# Patient Record
Sex: Female | Born: 1965 | Race: White | Hispanic: No | Marital: Married | Smoking: Former smoker
Health system: Southern US, Community
[De-identification: ages and names within clinical notes are randomized; demographics above are authoritative.]

## PROBLEM LIST (undated history)

## (undated) DIAGNOSIS — T7840XA Allergy, unspecified, initial encounter: Secondary | ICD-10-CM

## (undated) DIAGNOSIS — I341 Nonrheumatic mitral (valve) prolapse: Secondary | ICD-10-CM

## (undated) DIAGNOSIS — F419 Anxiety disorder, unspecified: Secondary | ICD-10-CM

## (undated) DIAGNOSIS — E785 Hyperlipidemia, unspecified: Secondary | ICD-10-CM

## (undated) DIAGNOSIS — F418 Other specified anxiety disorders: Secondary | ICD-10-CM

## (undated) DIAGNOSIS — E559 Vitamin D deficiency, unspecified: Secondary | ICD-10-CM

## (undated) DIAGNOSIS — E349 Endocrine disorder, unspecified: Secondary | ICD-10-CM

## (undated) DIAGNOSIS — N8501 Benign endometrial hyperplasia: Secondary | ICD-10-CM

## (undated) HISTORY — DX: Vitamin D deficiency, unspecified: E55.9

## (undated) HISTORY — DX: Hyperlipidemia, unspecified: E78.5

## (undated) HISTORY — DX: Endocrine disorder, unspecified: E34.9

## (undated) HISTORY — PX: DILATION AND CURETTAGE OF UTERUS: SHX78

## (undated) HISTORY — DX: Nonrheumatic mitral (valve) prolapse: I34.1

## (undated) HISTORY — DX: Anxiety disorder, unspecified: F41.9

## (undated) HISTORY — DX: Other specified anxiety disorders: F41.8

## (undated) HISTORY — DX: Benign endometrial hyperplasia: N85.01

## (undated) HISTORY — DX: Allergy, unspecified, initial encounter: T78.40XA

---

## 1987-03-21 HISTORY — PX: AUGMENTATION MAMMAPLASTY: SUR837

## 2000-01-06 ENCOUNTER — Other Ambulatory Visit: Admission: RE | Admit: 2000-01-06 | Discharge: 2000-01-06 | Payer: Self-pay | Admitting: Gynecology

## 2000-02-24 ENCOUNTER — Encounter: Payer: Self-pay | Admitting: Gynecology

## 2000-02-24 ENCOUNTER — Ambulatory Visit (HOSPITAL_COMMUNITY): Admission: RE | Admit: 2000-02-24 | Discharge: 2000-02-24 | Payer: Self-pay | Admitting: Gynecology

## 2000-05-18 ENCOUNTER — Other Ambulatory Visit: Admission: RE | Admit: 2000-05-18 | Discharge: 2000-05-18 | Payer: Self-pay | Admitting: Gynecology

## 2000-05-18 ENCOUNTER — Encounter (INDEPENDENT_AMBULATORY_CARE_PROVIDER_SITE_OTHER): Payer: Self-pay

## 2000-10-13 ENCOUNTER — Encounter: Payer: Self-pay | Admitting: Gynecology

## 2000-10-13 ENCOUNTER — Ambulatory Visit (HOSPITAL_COMMUNITY): Admission: RE | Admit: 2000-10-13 | Discharge: 2000-10-13 | Payer: Self-pay | Admitting: Gynecology

## 2001-11-05 ENCOUNTER — Other Ambulatory Visit: Admission: RE | Admit: 2001-11-05 | Discharge: 2001-11-05 | Payer: Self-pay | Admitting: Gynecology

## 2003-04-29 ENCOUNTER — Other Ambulatory Visit: Admission: RE | Admit: 2003-04-29 | Discharge: 2003-04-29 | Payer: Self-pay | Admitting: Gynecology

## 2004-07-27 ENCOUNTER — Other Ambulatory Visit: Admission: RE | Admit: 2004-07-27 | Discharge: 2004-07-27 | Payer: Self-pay | Admitting: Gynecology

## 2005-01-13 ENCOUNTER — Ambulatory Visit (HOSPITAL_COMMUNITY): Admission: RE | Admit: 2005-01-13 | Discharge: 2005-01-13 | Payer: Self-pay | Admitting: Internal Medicine

## 2005-02-14 ENCOUNTER — Encounter: Admission: RE | Admit: 2005-02-14 | Discharge: 2005-02-14 | Payer: Self-pay | Admitting: Gynecology

## 2005-10-16 ENCOUNTER — Other Ambulatory Visit: Admission: RE | Admit: 2005-10-16 | Discharge: 2005-10-16 | Payer: Self-pay | Admitting: Gynecology

## 2006-04-03 ENCOUNTER — Encounter: Admission: RE | Admit: 2006-04-03 | Discharge: 2006-04-03 | Payer: Self-pay | Admitting: Gynecology

## 2006-10-18 ENCOUNTER — Other Ambulatory Visit: Admission: RE | Admit: 2006-10-18 | Discharge: 2006-10-18 | Payer: Self-pay | Admitting: Gynecology

## 2006-10-19 DIAGNOSIS — N8501 Benign endometrial hyperplasia: Secondary | ICD-10-CM | POA: Insufficient documentation

## 2006-10-19 HISTORY — DX: Benign endometrial hyperplasia: N85.01

## 2006-10-19 HISTORY — PX: ENDOMETRIAL ABLATION: SHX621

## 2006-11-15 ENCOUNTER — Encounter: Payer: Self-pay | Admitting: Gynecology

## 2006-11-15 ENCOUNTER — Ambulatory Visit (HOSPITAL_BASED_OUTPATIENT_CLINIC_OR_DEPARTMENT_OTHER): Admission: RE | Admit: 2006-11-15 | Discharge: 2006-11-15 | Payer: Self-pay | Admitting: Gynecology

## 2007-04-11 ENCOUNTER — Encounter: Admission: RE | Admit: 2007-04-11 | Discharge: 2007-04-11 | Payer: Self-pay | Admitting: Gynecology

## 2007-10-21 ENCOUNTER — Other Ambulatory Visit: Admission: RE | Admit: 2007-10-21 | Discharge: 2007-10-21 | Payer: Self-pay | Admitting: Gynecology

## 2007-11-06 ENCOUNTER — Other Ambulatory Visit: Admission: RE | Admit: 2007-11-06 | Discharge: 2007-11-06 | Payer: Self-pay | Admitting: Gynecology

## 2008-04-13 ENCOUNTER — Encounter: Admission: RE | Admit: 2008-04-13 | Discharge: 2008-04-13 | Payer: Self-pay | Admitting: Gynecology

## 2008-11-12 ENCOUNTER — Ambulatory Visit: Payer: Self-pay | Admitting: Gynecology

## 2008-11-12 ENCOUNTER — Encounter: Payer: Self-pay | Admitting: Gynecology

## 2008-11-12 ENCOUNTER — Other Ambulatory Visit: Admission: RE | Admit: 2008-11-12 | Discharge: 2008-11-12 | Payer: Self-pay | Admitting: Gynecology

## 2008-11-17 ENCOUNTER — Ambulatory Visit: Payer: Self-pay | Admitting: Gynecology

## 2009-04-15 ENCOUNTER — Encounter: Admission: RE | Admit: 2009-04-15 | Discharge: 2009-04-15 | Payer: Self-pay | Admitting: Gynecology

## 2009-05-13 ENCOUNTER — Ambulatory Visit (HOSPITAL_COMMUNITY): Admission: RE | Admit: 2009-05-13 | Discharge: 2009-05-13 | Payer: Self-pay | Admitting: Internal Medicine

## 2009-11-17 ENCOUNTER — Other Ambulatory Visit: Admission: RE | Admit: 2009-11-17 | Discharge: 2009-11-17 | Payer: Self-pay | Admitting: Gynecology

## 2009-11-17 ENCOUNTER — Ambulatory Visit: Payer: Self-pay | Admitting: Gynecology

## 2010-01-24 ENCOUNTER — Ambulatory Visit: Payer: Self-pay | Admitting: Gynecology

## 2010-01-26 ENCOUNTER — Encounter: Admission: RE | Admit: 2010-01-26 | Discharge: 2010-01-26 | Payer: Self-pay | Admitting: Gynecology

## 2010-04-18 ENCOUNTER — Encounter
Admission: RE | Admit: 2010-04-18 | Discharge: 2010-04-18 | Payer: Self-pay | Source: Home / Self Care | Attending: Gynecology | Admitting: Gynecology

## 2010-08-02 NOTE — Op Note (Signed)
[name], [name]                ACCOUNT NO.:  [id number]   MEDICAL RECORD NO.:  [id number]          PATIENT TYPE:  AMB   LOCATION:  [facility]                         FACILITY:  [facility]   PHYSICIAN:  Timothy P. Fontaine, M.D.DATE OF BIRTH:  June 15, 1965   DATE OF PROCEDURE:  11/15/2006  DATE OF DISCHARGE:                               OPERATIVE REPORT   PREOPERATIVE DIAGNOSES:  Menorrhagia.   POSTOPERATIVE DIAGNOSES:  Menorrhagia.   PROCEDURE:  Novasure endometrial ablation, hysteroscopy dilation and  curettage.   SURGEON:  Timothy P. Fontaine, M.D.   ANESTHETIC:  MAC with 1% lidocaine paracervical block.   ESTIMATED BLOOD LOSS:  Minimal.   DISTENDING MEDIA DISCREPANCY:  Minimal   COMPLICATIONS:  None.   SPECIMEN:  Endometrial curetting.   FINDINGS:  EUA external B U S vagina normal.  Cervix normal, uterus  normal size, shape and contour, anteverted.  Adnexa without masses.  Hysteroscopic good distention, normal appearing cavity, good even  treatment.   PROCEDURE:  The patient was taken to the operating room, underwent IV  sedation, placed in the low dorsal lithotomy position, received a  perineal vaginal preparation with Betadine solution.  Bladder emptied  with in-and-out Foley catheterization in sterile technique.  EUA was  performed.  The patient was draped in usual fashion.  The cervix  visualized with a speculum, anterior lip grasped with single-tooth  tenaculum and a paracervical block using 1% lidocaine was placed, a  total of 10 mL.  Cervix was then gently gradually dilated, the cervical  length and endometrial lengths, cavity lengths were determined.  A sharp  curettage was then performed and the specimen was sent to pathology.  The Novasure device was then placed within the cavity.  The wand  engaged, and the cavity width was then determined.  The carbon dioxide  test was then performed and passed and subsequently the endometrial  ablation was performed.  The  settings are recorded at the end of this  dictation.  The wand was removed.  Hysteroscopy performed, good  distention, good even treatment, no evidence of perforation.  Instruments were all removed, the tenaculum removed.  Slight oozing at  the tenaculum site was addressed with silver nitrate.  The  speculum was then removed.  The patient placed in supine position, taken  to recovery room in good condition having tolerated the procedure well.  Novasure settings, uterine length 8 cm, cervical length 3 cm, cavity  length 5 cm, cavity width of 3.4 cm, power setting 94, treatment cycle 1  minute 15 seconds.      Timothy P. Fontaine, M.D.  Electronically Signed     TPF/MEDQ  D:  11/15/2006  T:  11/16/2006  Job:  [id number]

## 2010-08-02 NOTE — H&P (Signed)
[name], Mary [name]                ACCOUNT NO.:  [id number]   MEDICAL RECORD NO.:  [id number]          PATIENT TYPE:  AMB   LOCATION:  [facility]                         FACILITY:  [facility]   PHYSICIAN:  Timothy P. Fontaine, M.D.DATE OF BIRTH:  1966-03-19   DATE OF ADMISSION:  11/15/2006  DATE OF DISCHARGE:                              HISTORY & PHYSICAL   The patient is being admitted to [facility] November 15, 2006, at 9:15 for surgery.   CHIEF COMPLAINT:  Menorrhagia.   HISTORY OF PRESENT ILLNESS:  A 46 year old G1, P63 female, vasectomy  birth control, with worsening menorrhagia. Periods lasting 8-10 days,  life altering with normal sonohistogram, normal thyroid prolactin.  The  risks, benefits, indications, alternatives were reviewed with her, and  she wants to proceed with endometrial ablation, hysteroscopy D&C.   PAST MEDICAL HISTORY:  Uncomplicated other than anxiety, depression.   CURRENT MEDICATIONS:  Wellbutrin XL 300 mg daily.   ALLERGIES:  No allergies.   PAST SURGICAL HISTORY:  Breast augmentation and cesarean section.   REVIEW OF SYSTEMS:  Noncontributory.   FAMILY HISTORY:  Noncontributory.   SOCIAL HISTORY:  Noncontributory.   PHYSICAL EXAMINATION:  VITAL SIGNS:  Afebrile, vital signs stable.  HEENT:  Normal.  LUNGS:  Clear.  CARDIAC:  Regular rate.  No rubs, murmurs or gallops.  ABDOMINAL:  Exam benign.  PELVIC:  External BUS, vagina normal.  Cervix normal.  Uterus normal  size, midline mobile, nontender.  Adnexa without masses or tenderness.   ASSESSMENT:  A 45 year old G1, P74 female, whose husband plans vasectomy  in 1 month, with worsening periods for endometrial ablation,  hysteroscopy D&C.  I reviewed with the patient and her husband the  proposed surgery, expected intraoperative and postoperative courses,  short-term long-term issues associated with ablation.  She understands  that she should never achieve pregnancy following the procedure.  Her  husband is planning a vasectomy, but if another partner presents that  she should never get pregnant.  She understands this.  She also  understands that she will continue to need routine gynecologic care to  include Pap smears and routine surveillance such as endometrial  carcinoma surveillance in the future despite that possibly becoming  amenorrheic following the procedure.  The patient also understands there  are no guarantees as far as menorrhagia relief, that her bleeding may  continue, worsen or change following the procedure. and the issues  hematometra long term was also discussed.  The acute intraoperative  postoperative risks were reviewed. The risks of bleeding, transfusion,  infection internal organ damage including bowel, bladder, ureters,  vessels and nerves necessitating major exploratory reparative surgeries,  future reparative surgeries, bowel resection, ostomy formation either  immediately recognized or delay recognized, either through direct  perforation or transuterine thermal damage was all discussed, understood  and accepted.  The patient's questions were answered to her  satisfaction.  She is ready to proceed with surgery.      Timothy P. Fontaine, M.D.  Electronically Signed     TPF/MEDQ  D:  11/13/2006  T:  11/13/2006  Job:  [id number] 

## 2011-01-02 ENCOUNTER — Encounter: Payer: Self-pay | Admitting: Gynecology

## 2011-01-02 ENCOUNTER — Other Ambulatory Visit (HOSPITAL_COMMUNITY)
Admission: RE | Admit: 2011-01-02 | Discharge: 2011-01-02 | Disposition: A | Discharge status: Home / Self Care | Payer: PRIVATE HEALTH INSURANCE | Source: Ambulatory Visit | Attending: Gynecology | Admitting: Gynecology

## 2011-01-02 ENCOUNTER — Ambulatory Visit (INDEPENDENT_AMBULATORY_CARE_PROVIDER_SITE_OTHER): Payer: PRIVATE HEALTH INSURANCE | Admitting: Gynecology

## 2011-01-02 VITALS — BP 120/70 | Ht 66.5 in | Wt 138.0 lb

## 2011-01-02 DIAGNOSIS — R823 Hemoglobinuria: Secondary | ICD-10-CM

## 2011-01-02 DIAGNOSIS — N898 Other specified noninflammatory disorders of vagina: Secondary | ICD-10-CM

## 2011-01-02 DIAGNOSIS — Z01419 Encounter for gynecological examination (general) (routine) without abnormal findings: Secondary | ICD-10-CM | POA: Insufficient documentation

## 2011-01-02 DIAGNOSIS — B373 Candidiasis of vulva and vagina: Secondary | ICD-10-CM

## 2011-01-02 MED ORDER — FLUCONAZOLE 150 MG PO TABS: 150.0000 mg | ORAL_TABLET | Freq: Once | ORAL | Status: AC

## 2011-01-02 NOTE — Telephone Encounter (Signed)
Erroneous encounter

## 2011-01-02 NOTE — Progress Notes (Signed)
Addended by: Landis Martins R on: 01/02/2011 10:34 AM   Modules accepted: Orders

## 2011-01-02 NOTE — Progress Notes (Signed)
Mary [name] June 22, 1965 [id number]        45 y.o.  for annual exam.  Was seen a year ago for left breast lump she had mammogram ultrasound which showed a fat lobule. She says this area has remained unchanged her self-exam and she had her follow up mammogram January 2012 which was normal  Past medical history,surgical history, medications, allergies, family history and social history were all reviewed and documented in the EPIC chart. ROS:  Was performed and pertinent positives and negatives are included in the history.  Exam: chaperone present Filed Vitals:   01/02/11 0937  BP: 120/70   General appearance  Normal Skin grossly normal Head/Neck normal with no cervical or supraclavicular adenopathy thyroid normal Lungs  clear Cardiac RR, without RMG Abdominal  soft, nontender, without masses, organomegaly or hernia Breasts  examined lying and sitting without masses, retractions, discharge or axillary adenopathy.  Bilateral implants noted. The area on the left where she is pointing is at the edge of her pectoralis muscle ridge and are no abnormalities palpated. I think what she is feeling is the edges of her implant. Pelvic  Ext/BUS/vagina  normal with white discharge, KOH wet prep done  Cervix  normal  Pap done  Uterus  anteverted, normal size, shape and contour, midline and mobile nontender   Adnexa  Without masses or tenderness    Anus and perineum  normal   Rectovaginal  normal sphincter tone without palpated masses or tenderness.    Assessment/Plan:  45 y.o. female for annual exam.    1. Breast mass. The area on the left where the patient's pointing I think is the edge of her implant. There is no palpable mass or other abnormality. Given the normal ultrasound showing a fat lobule negative mammogram and this area remained unchanged to her self-exam the past year we'll continue with expectant management and she is comfortable with this. If there is any change on self breast exam she is  to follow up for evaluation. 2. White discharge. KOH wet prep is positive for yeast. She notes after intercourse the last time she'll bleeding I think it probably is due to a vaginitis. We'll treat with Diflucan 150x1 dose follow up if symptoms persist or recur 3. Health maintenance. Self breast exams on a monthly basis again reviewed. As long as her exam remains unchanged we'll follow. She's due for her mammogram in January she knows to follow up for this. No blood work was done today as it's all done through her primary's office. Assuming she continues well then she'll see [place] in a year sooner as needed.    Dara Lords MD, 10:04 AM 01/02/2011

## 2011-03-09 ENCOUNTER — Telehealth: Payer: Self-pay | Admitting: *Deleted

## 2011-03-09 MED ORDER — FLUCONAZOLE 150 MG PO TABS: 150.0000 mg | ORAL_TABLET | Freq: Once | ORAL | Status: AC

## 2011-03-09 NOTE — Telephone Encounter (Signed)
Pt calling c/o yeast infection itching, white discharge x 2 days now. Pt last office visit on 01/02/11. Pt would like diflucan sent to pharmacy. Please advise

## 2011-03-09 NOTE — Telephone Encounter (Signed)
Diflucan 150mg x 1

## 2011-03-09 NOTE — Telephone Encounter (Signed)
Pt informed with the below note. 

## 2011-04-19 ENCOUNTER — Other Ambulatory Visit: Payer: Self-pay | Admitting: Gynecology

## 2011-04-19 DIAGNOSIS — Z1231 Encounter for screening mammogram for malignant neoplasm of breast: Secondary | ICD-10-CM

## 2011-05-05 ENCOUNTER — Ambulatory Visit
Admission: RE | Admit: 2011-05-05 | Discharge: 2011-05-05 | Disposition: A | Discharge status: Home / Self Care | Payer: PRIVATE HEALTH INSURANCE | Source: Ambulatory Visit | Attending: Gynecology | Admitting: Gynecology

## 2011-05-05 DIAGNOSIS — Z1231 Encounter for screening mammogram for malignant neoplasm of breast: Secondary | ICD-10-CM

## 2012-03-19 ENCOUNTER — Encounter: Payer: PRIVATE HEALTH INSURANCE | Admitting: Gynecology

## 2012-03-20 DIAGNOSIS — R7989 Other specified abnormal findings of blood chemistry: Secondary | ICD-10-CM

## 2012-03-20 DIAGNOSIS — E349 Endocrine disorder, unspecified: Secondary | ICD-10-CM

## 2012-03-20 HISTORY — DX: Endocrine disorder, unspecified: E34.9

## 2012-03-20 HISTORY — DX: Other specified abnormal findings of blood chemistry: R79.89

## 2012-04-08 ENCOUNTER — Other Ambulatory Visit (HOSPITAL_COMMUNITY)
Admission: RE | Admit: 2012-04-08 | Discharge: 2012-04-08 | Disposition: A | Discharge status: Home / Self Care | Payer: BC Managed Care – PPO | Source: Ambulatory Visit | Attending: Gynecology | Admitting: Gynecology

## 2012-04-08 ENCOUNTER — Encounter: Payer: Self-pay | Admitting: Gynecology

## 2012-04-08 ENCOUNTER — Ambulatory Visit (INDEPENDENT_AMBULATORY_CARE_PROVIDER_SITE_OTHER): Payer: BC Managed Care – PPO | Admitting: Gynecology

## 2012-04-08 VITALS — BP 116/74 | Ht 66.0 in | Wt 145.0 lb

## 2012-04-08 DIAGNOSIS — Z1151 Encounter for screening for human papillomavirus (HPV): Secondary | ICD-10-CM | POA: Insufficient documentation

## 2012-04-08 DIAGNOSIS — N951 Menopausal and female climacteric states: Secondary | ICD-10-CM

## 2012-04-08 DIAGNOSIS — Z01419 Encounter for gynecological examination (general) (routine) without abnormal findings: Secondary | ICD-10-CM

## 2012-04-08 DIAGNOSIS — I341 Nonrheumatic mitral (valve) prolapse: Secondary | ICD-10-CM | POA: Insufficient documentation

## 2012-04-08 NOTE — Progress Notes (Signed)
LERLINE VALDIVIA 1965-11-23 [id number]        47 y.o.  G1P1001 for annual exam.  Several issues noted below.  Past medical history,surgical history, medications, allergies, family history and social history were all reviewed and documented in the EPIC chart. ROS:  Was performed and pertinent positives and negatives are included in the history.  Exam: [name] assistant Filed Vitals:   04/08/12 1355  BP: 116/74  Height: 5\' 6"  (1.676 m)  Weight: 145 lb (65.772 kg)   General appearance  Normal Skin grossly normal Head/Neck normal with no cervical or supraclavicular adenopathy thyroid normal Lungs  clear Cardiac RR, without RMG Abdominal  soft, nontender, without masses, organomegaly or hernia Breasts  examined lying and sitting without masses, retractions, discharge or axillary adenopathy.  Bilateral implants noted. Pelvic  Ext/BUS/vagina  normal   Cervix  With stenotic os. Pap/HPV done  Uterus  anteverted, normal size, shape and contour, midline and mobile nontender   Adnexa  Without masses or tenderness    Anus and perineum  normal   Rectovaginal  normal sphincter tone without palpated masses or tenderness.    Assessment/Plan:  47 y.o. G47P1001 female for annual exam.   1. History of NovaSure endometrial ablation.  Amenorrheic. She does note occasional hot flash type symptoms. We'll check baseline TSH FSH. 2. Pap smear 12/2010 without endocervical cells but otherwise normal.  Her cervix does appear stenotic and I did a Pap/HPV and we'll see what this returns in and triage based upon these results. 3. Mammography.  Due February 2014. Patient knows to schedule this.  She is an area at the edge of her implant in the left breast 12:00 position where she has felt some nodularity in the past. Practitioner exams have always been normal and she had an ultrasound which showed just normal fatty tissue in this area. This area has remained unchanged her exam and again today I feel no abnormalities. We'll  continue with observation. If she feels any change in his to represent for evaluation. 4. Health maintenance.  Dr. Oneta Rack does her routine blood work and she sees them on a regular basis.we'll follow for her hormone studies and Pap smear results otherwise in one year for her annual exam.    Dara Lords MD, 2:59 PM 04/08/2012

## 2012-04-08 NOTE — Patient Instructions (Signed)
Office will contact you with Pap smear and hormone levels. Otherwise follow up in one year for your annual exam.

## 2012-04-09 ENCOUNTER — Encounter: Payer: Self-pay | Admitting: Gynecology

## 2012-04-09 LAB — URINALYSIS W MICROSCOPIC + REFLEX CULTURE
Bilirubin Urine: NEGATIVE
Casts: NONE SEEN
Glucose, UA: NEGATIVE mg/dL
Protein, ur: NEGATIVE mg/dL
Specific Gravity, Urine: 1.015 (ref 1.005–1.030)
Squamous Epithelial / LPF: NONE SEEN

## 2012-05-23 ENCOUNTER — Other Ambulatory Visit: Payer: Self-pay

## 2012-05-23 DIAGNOSIS — Z1231 Encounter for screening mammogram for malignant neoplasm of breast: Secondary | ICD-10-CM

## 2012-06-27 ENCOUNTER — Ambulatory Visit
Admission: RE | Admit: 2012-06-27 | Discharge: 2012-06-27 | Disposition: A | Discharge status: Home / Self Care | Payer: BC Managed Care – PPO | Source: Ambulatory Visit

## 2012-06-27 DIAGNOSIS — Z1231 Encounter for screening mammogram for malignant neoplasm of breast: Secondary | ICD-10-CM

## 2012-06-28 ENCOUNTER — Other Ambulatory Visit: Payer: Self-pay | Admitting: *Deleted

## 2012-06-28 DIAGNOSIS — N6310 Unspecified lump in the right breast, unspecified quadrant: Secondary | ICD-10-CM

## 2012-07-11 ENCOUNTER — Ambulatory Visit
Admission: RE | Admit: 2012-07-11 | Discharge: 2012-07-11 | Disposition: A | Discharge status: Home / Self Care | Payer: BC Managed Care – PPO | Source: Ambulatory Visit | Attending: Gynecology | Admitting: Gynecology

## 2012-07-11 DIAGNOSIS — N6310 Unspecified lump in the right breast, unspecified quadrant: Secondary | ICD-10-CM

## 2013-01-23 ENCOUNTER — Telehealth: Payer: Self-pay | Admitting: *Deleted

## 2013-01-23 DIAGNOSIS — N390 Urinary tract infection, site not specified: Secondary | ICD-10-CM

## 2013-01-23 MED ORDER — SULFAMETHOXAZOLE-TMP DS 800-160 MG PO TABS: 1.0000 | ORAL_TABLET | Freq: Two times a day (BID) | ORAL | Status: DC

## 2013-01-23 NOTE — Telephone Encounter (Signed)
Advised patient Bactrim DS was sent escript to [facility]

## 2013-02-28 ENCOUNTER — Telehealth: Payer: Self-pay | Admitting: *Deleted

## 2013-02-28 MED ORDER — FLUCONAZOLE 150 MG PO TABS: 150.0000 mg | ORAL_TABLET | Freq: Once | ORAL | Status: DC

## 2013-02-28 NOTE — Telephone Encounter (Signed)
Pt informed, rx sent 

## 2013-02-28 NOTE — Telephone Encounter (Signed)
Diflucan 150mg x 1 dose

## 2013-02-28 NOTE — Telephone Encounter (Signed)
Pt calling c/o yeast infection itching white discharge, pt unable to come today her schedule. Pt is requesting Rx to help with infection, she is scheduled for annual on 04/10/13. Please advise

## 2013-04-10 ENCOUNTER — Encounter: Payer: Self-pay | Admitting: Gynecology

## 2013-04-10 ENCOUNTER — Ambulatory Visit (INDEPENDENT_AMBULATORY_CARE_PROVIDER_SITE_OTHER): Payer: BC Managed Care – PPO | Admitting: Gynecology

## 2013-04-10 VITALS — BP 112/66 | Ht 66.0 in | Wt 148.0 lb

## 2013-04-10 DIAGNOSIS — Z01419 Encounter for gynecological examination (general) (routine) without abnormal findings: Secondary | ICD-10-CM

## 2013-04-10 DIAGNOSIS — N951 Menopausal and female climacteric states: Secondary | ICD-10-CM

## 2013-04-10 NOTE — Progress Notes (Signed)
Mary [name] 1966/02/26 [id number]        48 y.o.  G1P1001 for annual exam.  Several issues noted below.  Past medical history,surgical history, problem list, medications, allergies, family history and social history were all reviewed and documented in the EPIC chart.  ROS:  Performed and pertinent positives and negatives are included in the history, assessment and plan .  Exam: [name] assistant Filed Vitals:   04/10/13 1049  BP: 112/66  Height: 5\' 6"  (1.676 m)  Weight: 148 lb (67.132 kg)   General appearance  Normal Skin grossly normal Head/Neck normal with no cervical or supraclavicular adenopathy thyroid normal Lungs  clear Cardiac RR, without RMG Abdominal  soft, nontender, without masses, organomegaly or hernia Breasts  examined lying and sitting without masses, retractions, discharge or axillary adenopathy. Bilateral implants noted. Pelvic  Ext/BUS/vagina  Normal  Cervix  Normal  Uterus  anteverted, normal size, shape and contour, midline and mobile nontender   Adnexa  Without masses or tenderness    Anus and perineum  Normal   Rectovaginal  Normal sphincter tone without palpated masses or tenderness.    Assessment/Plan:  48 y.o. G6P1001 female for annual exam, amenorrheic, vasectomy birth control.   1. Amenorrhea. Patient has been amenorrheic since her ablation. Is having some hot flashes and vaginal dryness. Ragan last year 29.9. No bleeding at all. Options for management to include vaginal lubricants/moisturizers, OTC soy based products, HRT reviewed. Risks/benefits discussed to include WHI study was recorded at DVT and breast cancer. ACOG and NAMS statements for lowest dose for shortest period of time discussed. Patient is not interested in HRT at this point. Will call if she wants to pursue this. Knows to report any bleeding. 2. Pap/HPV negative 03/2012. Pap smear done today. No history of abnormal Pap smears previously. Plan repeat Pap smear in 3-5 year  interval. 3. Mammography/2014. Has always had an area of nodularity age of her left breast implant 12:00 that she has felt but has never been appreciated by practitioner exams. Mammography sent always been normal. This area has remained unchanged for the patient. Will continue to monitor and report any changes to self-exam. Her exam today is totally normal with the implants. Continue with annual mammography. SBE reviewed. 4. Health maintenance.   routine blood work reported to her primary physician's office. No blood work done today. Followup in one year, sooner as needed.   Note: This document was prepared with digital dictation and possible smart phrase technology. Any transcriptional errors that result from this process are unintentional.   Anastasio Auerbach MD, 11:11 AM 04/10/2013

## 2013-04-10 NOTE — Patient Instructions (Signed)
Followup in one year for annual exam. Sooner if you want to rediscuss options for menopausal symptoms.

## 2013-04-11 LAB — URINALYSIS W MICROSCOPIC + REFLEX CULTURE
Casts: NONE SEEN
Glucose, UA: NEGATIVE mg/dL
Hgb urine dipstick: NEGATIVE
LEUKOCYTES UA: NEGATIVE
NITRITE: NEGATIVE
PROTEIN: NEGATIVE mg/dL
RBC / HPF: 2 RBC/hpf (ref <3)
SPECIFIC GRAVITY, URINE: 1.03 (ref 1.005–1.030)
UROBILINOGEN UA: 1 mg/dL (ref 0.0–1.0)
pH: 6 (ref 5.0–8.0)

## 2013-04-12 LAB — URINE CULTURE
Colony Count: NO GROWTH
ORGANISM ID, BACTERIA: NO GROWTH

## 2013-06-10 ENCOUNTER — Other Ambulatory Visit: Payer: Self-pay

## 2013-06-10 DIAGNOSIS — Z9882 Breast implant status: Secondary | ICD-10-CM

## 2013-06-10 DIAGNOSIS — Z1231 Encounter for screening mammogram for malignant neoplasm of breast: Secondary | ICD-10-CM

## 2013-07-08 ENCOUNTER — Encounter (INDEPENDENT_AMBULATORY_CARE_PROVIDER_SITE_OTHER): Payer: Self-pay

## 2013-07-08 ENCOUNTER — Ambulatory Visit
Admission: RE | Admit: 2013-07-08 | Discharge: 2013-07-08 | Disposition: A | Discharge status: Home / Self Care | Payer: BC Managed Care – PPO | Source: Ambulatory Visit

## 2013-07-08 DIAGNOSIS — Z1231 Encounter for screening mammogram for malignant neoplasm of breast: Secondary | ICD-10-CM

## 2013-07-08 DIAGNOSIS — Z9882 Breast implant status: Secondary | ICD-10-CM

## 2013-08-28 ENCOUNTER — Telehealth: Payer: Self-pay

## 2013-08-28 NOTE — Telephone Encounter (Signed)
Patient called stating she had Novasure endo ablation 7-8 years ago and last night after sex she had bleeding. Never has happened before. Has stopped now.  She wanted to know if all ok. I recommended office visit to assess and transferred her to appt desk.

## 2013-09-09 ENCOUNTER — Encounter: Payer: Self-pay | Admitting: Gynecology

## 2013-09-09 ENCOUNTER — Ambulatory Visit (INDEPENDENT_AMBULATORY_CARE_PROVIDER_SITE_OTHER): Payer: BC Managed Care – PPO | Admitting: Gynecology

## 2013-09-09 DIAGNOSIS — N930 Postcoital and contact bleeding: Secondary | ICD-10-CM

## 2013-09-09 DIAGNOSIS — N898 Other specified noninflammatory disorders of vagina: Secondary | ICD-10-CM

## 2013-09-09 LAB — WET PREP FOR TRICH, YEAST, CLUE
Clue Cells Wet Prep HPF POC: NONE SEEN
Trich, Wet Prep: NONE SEEN

## 2013-09-09 MED ORDER — FLUCONAZOLE 150 MG PO TABS: 150.0000 mg | ORAL_TABLET | Freq: Once | ORAL | Status: DC

## 2013-09-09 NOTE — Patient Instructions (Signed)
Take the Diflucan pills one daily for 2 days. Call if the bleeding continues.

## 2013-09-09 NOTE — Progress Notes (Signed)
Mary [name] 11/30/1965 [id number]        48 y.o.  G1P1001 presents with single episode of post coital spotting last week. History of endometrial ablation with no menses or bleeding at all. Had no discomfort with the episode of bleeding/intercourse. No irritation or vaginal discharge or odor. Has not had any episodes since.  Past medical history,surgical history, problem list, medications, allergies, family history and social history were all reviewed and documented in the EPIC chart.  Directed ROS with pertinent positives and negatives documented in the history of present illness/assessment and plan.  Exam: Mary [name] General appearance  Normal Abdomen soft without masses guarding rebound Pelvic external BUS vagina with slight white discharge. Cervix normal. Uterus normal size midline mobile nontender. Adnexa without masses or tenderness  Assessment/Plan:  48 y.o. G1P1001 with single episode of post coital bleeding. Wet prep is positive for yeast. Suspect some mild cervicitis as etiology. We'll treat with Diflucan 150 mg x2 days. Followup if recurs. If so then we'll start with sonohysterogram to rule out intracavitary abnormalities.   Note: This document was prepared with digital dictation and possible smart phrase technology. Any transcriptional errors that result from this process are unintentional.   Mary Auerbach MD, 9:55 AM 09/09/2013

## 2013-09-09 NOTE — Addendum Note (Signed)
Addended by: Nelva Nay on: 09/09/2013 11:52 AM   Modules accepted: Orders

## 2013-11-10 ENCOUNTER — Ambulatory Visit (INDEPENDENT_AMBULATORY_CARE_PROVIDER_SITE_OTHER): Payer: BC Managed Care – PPO | Admitting: Physician Assistant

## 2013-11-10 ENCOUNTER — Encounter: Payer: Self-pay | Admitting: Physician Assistant

## 2013-11-10 VITALS — BP 110/62 | HR 84 | Temp 97.9°F | Resp 16 | Ht 66.0 in | Wt 153.0 lb

## 2013-11-10 DIAGNOSIS — N390 Urinary tract infection, site not specified: Secondary | ICD-10-CM

## 2013-11-10 MED ORDER — FLUCONAZOLE 150 MG PO TABS: 150.0000 mg | ORAL_TABLET | Freq: Every day | ORAL | Status: DC

## 2013-11-10 MED ORDER — CIPROFLOXACIN HCL 500 MG PO TABS
500.0000 mg | ORAL_TABLET | Freq: Two times a day (BID) | ORAL | Status: DC
Start: 2013-11-10 — End: 2014-03-10

## 2013-11-10 NOTE — Patient Instructions (Signed)
VAGINAL DRYNESS OVERVIEW  Vaginal dryness, also known as atrophic vaginitis, is a common condition in postmenopausal women. This condition is also common in women who have had both ovaries removed at the time of hysterectomy.   Some women have uncomfortable symptoms of vaginal dryness, such as pain with sex, burning vaginal discomfort or itching, or abnormal vaginal discharge, while others have no symptoms at all.  VAGINAL DRYNESS CAUSES   Estrogen helps to keep the vagina moist and to maintain thickness of the vaginal lining. Vaginal dryness occurs when the ovaries produce a decreased amount of estrogen. This can occur at certain times in a woman's life, and may be permanent or temporary. Times when less estrogen is made include: ?At the time of menopause. ?After surgical removal of the ovaries, chemotherapy, or radiation therapy of the pelvis for cancer. ?After having a baby, particularly in women who breastfeed. ?While using certain medications, such as danazol, medroxyprogesterone (brand names: Provera or DepoProvera), leuprolide (brand name: Lupron), or nafarelin. When these medications are stopped, estrogen production resumes.  Women who smoke cigarettes have been shown to have an increased risk of an earlier menopause transition as compared to non-smokers. Therefore, atrophic vaginitis symptoms may appear at a younger age in this population.  VAGINAL DRYNESS TREATMENT   There are three treatment options for women with vaginal dryness:  Vaginal lubricants and moisturizers - Vaginal lubricants and moisturizers can be purchased without a prescription. These products do not contain any hormones and have virtually no side effects. - Albolene is found in the facial cleanser section at [facility], [facility], or [employer]. It is a large jar with a blue top. This is the best lubricant for women because it is hypoallergenic. -Natural lubricants, such as olive, avocado or peanut oil, are easily available  products that may be used as a lubricant with sex.  -Vaginal moisturizes (eg, Replens, Moist Again, Vagisil, K-Y Silk-E, and Feminease) are formulated to allow water to be retained in the vaginal tissues. Moisturizers are applied into the vagina three times weekly to allow a continued moisturizing effect. These should not be used just before having sex, as they can be irritating.  Vaginal estrogen - Vaginal estrogen is the most effective treatment option for women with vaginal dryness. Vaginal estrogen must be prescribed by a healthcare provider. Very low doses of vaginal estrogen can be used when it is put into the vagina to treat vaginal dryness. A small amount of estrogen is absorbed into the bloodstream, but only about 100 times less than when using estrogen pills or tablets. As a result, there is a much lower risk of side effects, such as blood clots, breast cancer, and heart attack, compared with other estrogen-containing products (birth control pills, menopausal hormone therapy).   Ospemifene - Ospemifene is a prescription medication that is similar to estrogen, but is not estrogen. In the vaginal tissue, it acts similarly to estrogen. In the breast tissue, it acts as an estrogen blocker. It comes in a pill, and is prescribed for women who want to use an estrogen-like medication for vaginal dryness or painful sex associated with vaginal dryness, but prefer not to use a vaginal medication. The medication may cause hot flashes as a side effect. This type of medication may increase the risk of blood clots or uterine cancer. Further study of ospemifene is needed to evaluate the risk of these complications. This medication has not been tested in women who have had breast cancer or are at a high risk of developing   breast cancer.    Sexual activity - Vaginal estrogen improves vaginal dryness quickly, usually within a few weeks. You may continue to have sex as you treat vaginal dryness because sex itself  can help to keep the vaginal tissues healthy. Vaginal intercourse may help the vaginal tissues by keeping them soft and stretchable and preventing the tissues from shrinking.  If sex continues to be painful despite treatment for vaginal dryness, talk to your healthcare provider.    

## 2013-11-10 NOTE — Progress Notes (Signed)
   Subjective:    Patient ID: Mary [name], female    DOB: September 14, 1965, 48 y.o.   MRN: [id number]  Dysuria  This is a new problem. Episode onset: this morning. The problem occurs every urination. The problem has been unchanged. The quality of the pain is described as burning. There has been no fever. She is sexually active (symptoms started after intercourse). There is no history of pyelonephritis. Associated symptoms include a discharge (nonodours white discharge) and frequency. Pertinent negatives include no chills, flank pain, hematuria, hesitancy, nausea, possible pregnancy, sweats, urgency or vomiting. She has tried increased fluids for the symptoms. The treatment provided no relief. There is no history of kidney stones. going out of town wednesday     Review of Systems  Constitutional: Negative for fever, chills, appetite change and fatigue.  HENT: Negative.   Respiratory: Negative.   Cardiovascular: Negative.   Gastrointestinal: Negative.  Negative for nausea and vomiting.  Genitourinary: Positive for dysuria and frequency. Negative for hesitancy, urgency, hematuria and flank pain.  Neurological: Negative.        Objective:   Physical Exam  Constitutional: She is oriented to person, place, and time. She appears well-developed and well-nourished.  Neck: Normal range of motion. Neck supple.  Cardiovascular: Normal rate and regular rhythm.   Pulmonary/Chest: Effort normal and breath sounds normal.  Abdominal: Soft. Bowel sounds are normal. She exhibits no distension and no mass. There is tenderness (suprapelvic). There is no rebound and no guarding.  Musculoskeletal: Normal range of motion. She exhibits no tenderness.  No CVA tenderness  Neurological: She is alert and oriented to person, place, and time.  Skin: Skin is warm and dry.      Assessment & Plan:  1. UTI (urinary tract infection), uncomplicated Discussed vaginal atrophy symptoms.  - Urinalysis, Routine w reflex  microscopic - Urine culture - ciprofloxacin (CIPRO) 500 MG tablet; Take 1 tablet (500 mg total) by mouth 2 (two) times daily.  Dispense: 6 tablet; Refill: 0 - fluconazole (DIFLUCAN) 150 MG tablet; Take 1 tablet (150 mg total) by mouth daily.  Dispense: 1 tablet; Refill: 3

## 2013-11-11 LAB — URINALYSIS, ROUTINE W REFLEX MICROSCOPIC
BILIRUBIN URINE: NEGATIVE
Glucose, UA: NEGATIVE mg/dL
Ketones, ur: NEGATIVE mg/dL
Nitrite: NEGATIVE
Protein, ur: NEGATIVE mg/dL
SPECIFIC GRAVITY, URINE: 1.013 (ref 1.005–1.030)
UROBILINOGEN UA: 0.2 mg/dL (ref 0.0–1.0)
pH: 6 (ref 5.0–8.0)

## 2013-11-11 LAB — URINALYSIS, MICROSCOPIC ONLY
CRYSTALS: NONE SEEN
Casts: NONE SEEN
Squamous Epithelial / LPF: NONE SEEN
WBC, UA: >50 WBC/hpf — AB (ref <3)

## 2013-11-12 LAB — URINE CULTURE

## 2014-01-19 ENCOUNTER — Encounter: Payer: Self-pay | Admitting: Physician Assistant

## 2014-02-02 ENCOUNTER — Ambulatory Visit (INDEPENDENT_AMBULATORY_CARE_PROVIDER_SITE_OTHER): Payer: BC Managed Care – PPO | Admitting: *Deleted

## 2014-02-02 DIAGNOSIS — Z23 Encounter for immunization: Secondary | ICD-10-CM

## 2014-03-10 ENCOUNTER — Encounter: Payer: Self-pay | Admitting: Physician Assistant

## 2014-03-10 ENCOUNTER — Ambulatory Visit (INDEPENDENT_AMBULATORY_CARE_PROVIDER_SITE_OTHER): Payer: BC Managed Care – PPO | Admitting: Physician Assistant

## 2014-03-10 VITALS — BP 122/68 | HR 64 | Temp 97.9°F | Resp 16 | Ht 66.0 in | Wt 154.0 lb

## 2014-03-10 DIAGNOSIS — E538 Deficiency of other specified B group vitamins: Secondary | ICD-10-CM

## 2014-03-10 DIAGNOSIS — F418 Other specified anxiety disorders: Secondary | ICD-10-CM

## 2014-03-10 DIAGNOSIS — Z79899 Other long term (current) drug therapy: Secondary | ICD-10-CM

## 2014-03-10 DIAGNOSIS — I341 Nonrheumatic mitral (valve) prolapse: Secondary | ICD-10-CM

## 2014-03-10 DIAGNOSIS — Z1212 Encounter for screening for malignant neoplasm of rectum: Secondary | ICD-10-CM

## 2014-03-10 DIAGNOSIS — E559 Vitamin D deficiency, unspecified: Secondary | ICD-10-CM

## 2014-03-10 DIAGNOSIS — E785 Hyperlipidemia, unspecified: Secondary | ICD-10-CM

## 2014-03-10 DIAGNOSIS — Z0001 Encounter for general adult medical examination with abnormal findings: Secondary | ICD-10-CM

## 2014-03-10 DIAGNOSIS — F325 Major depressive disorder, single episode, in full remission: Secondary | ICD-10-CM | POA: Insufficient documentation

## 2014-03-10 LAB — HEPATIC FUNCTION PANEL
ALT: 8 U/L (ref 0–35)
AST: 15 U/L (ref 0–37)
Albumin: 4.4 g/dL (ref 3.5–5.2)
Alkaline Phosphatase: 71 U/L (ref 39–117)
BILIRUBIN DIRECT: 0.1 mg/dL (ref 0.0–0.3)
BILIRUBIN INDIRECT: 0.5 mg/dL (ref 0.2–1.2)
BILIRUBIN TOTAL: 0.6 mg/dL (ref 0.2–1.2)
Total Protein: 6.9 g/dL (ref 6.0–8.3)

## 2014-03-10 LAB — CBC WITH DIFFERENTIAL/PLATELET
Basophils Absolute: 0 10*3/uL (ref 0.0–0.1)
Basophils Relative: 0 % (ref 0–1)
Eosinophils Absolute: 0 10*3/uL (ref 0.0–0.7)
Eosinophils Relative: 1 % (ref 0–5)
HCT: 39.6 % (ref 36.0–46.0)
HEMOGLOBIN: 13.5 g/dL (ref 12.0–15.0)
LYMPHS ABS: 1.3 10*3/uL (ref 0.7–4.0)
LYMPHS PCT: 30 % (ref 12–46)
MCH: 32.2 pg (ref 26.0–34.0)
MCHC: 34.1 g/dL (ref 30.0–36.0)
MCV: 94.5 fL (ref 78.0–100.0)
MPV: 9.6 fL (ref 9.4–12.4)
Monocytes Absolute: 0.3 10*3/uL (ref 0.1–1.0)
Monocytes Relative: 8 % (ref 3–12)
NEUTROS ABS: 2.6 10*3/uL (ref 1.7–7.7)
NEUTROS PCT: 61 % (ref 43–77)
Platelets: 242 10*3/uL (ref 150–400)
RBC: 4.19 MIL/uL (ref 3.87–5.11)
RDW: 12.9 % (ref 11.5–15.5)
WBC: 4.3 10*3/uL (ref 4.0–10.5)

## 2014-03-10 LAB — BASIC METABOLIC PANEL WITH GFR
BUN: 13 mg/dL (ref 6–23)
CO2: 28 mEq/L (ref 19–32)
Calcium: 9.4 mg/dL (ref 8.4–10.5)
Chloride: 103 mEq/L (ref 96–112)
Creat: 0.72 mg/dL (ref 0.50–1.10)
GFR, Est African American: >89 mL/min
GFR, Est Non African American: >89 mL/min
Glucose, Bld: 75 mg/dL (ref 70–99)
Potassium: 4.1 mEq/L (ref 3.5–5.3)
SODIUM: 140 meq/L (ref 135–145)

## 2014-03-10 LAB — LIPID PANEL
CHOL/HDL RATIO: 2.3 ratio
CHOLESTEROL: 227 mg/dL — AB (ref 0–200)
HDL: 97 mg/dL (ref >39)
LDL CALC: 117 mg/dL — AB (ref 0–99)
TRIGLYCERIDES: 64 mg/dL (ref <150)
VLDL: 13 mg/dL (ref 0–40)

## 2014-03-10 LAB — MAGNESIUM: Magnesium: 1.8 mg/dL (ref 1.5–2.5)

## 2014-03-10 NOTE — Progress Notes (Signed)
Complete Physical  Assessment and Plan: 1. Vitamin D deficiency - Vit D  25 hydroxy (rtn osteoporosis monitoring)  2. Hyperlipidemia - CBC with Differential - BASIC METABOLIC PANEL WITH GFR - Hepatic function panel - Lipid panel - TSH - Urinalysis, Routine w reflex microscopic - Microalbumin / creatinine urine ratio  3. Depression with anxiety Depression- continue medications, stress management techniques discussed, increase water, good sleep hygiene discussed, increase exercise, and increase veggies.   4. MVP (mitral valve prolapse) Monitor, no symptoms.   5. B12 deficiency - Vitamin B12  6. Encounter for general adult medical examination with abnormal findings  7. Screening for rectal cancer - POC Hemoccult Bld/Stl (3-Cd Home Screen); Future  8. Medication management - Magnesium   Discussed med's effects and SE's. Screening labs and tests as requested with regular follow-up as recommended.  HPI  48 y.o. female  presents for a complete physical.   Her blood pressure has been controlled at home, today their BP is BP: 122/68 mmHg She does workout but has not been doing as much for the holidays. She denies chest pain, shortness of breath, dizziness.  She is not on cholesterol medication and denies myalgias. Her cholesterol is at goal. The cholesterol last visit was: LDL 77    Last A1C in the office was: 5.2  Patient is on Vitamin D supplement.  28 B12 269 She is on Wellbutrin 300 XL daily, she states it is helping her for depression.  Married, has 40 year old daughter at [place], wants to go into [place].   Current Medications:  Current Outpatient Prescriptions on File Prior to Visit  Medication Sig Dispense Refill  . BuPROPion HCl (WELLBUTRIN PO) Take 300 mg by mouth.      No current facility-administered medications on file prior to visit.   Health Maintenance:   Immunization History  Administered Date(s) Administered  . Influenza Split 02/02/2014   Tetanus:  2010 Pneumovax: 2000 Flu vaccine: 2015  Zostavax: LMP: None, had ablation Pap: 03/2012 Dr. Phineas Real MGM:06/2013  DEXA: Colonoscopy: due 2 year EGD: Last Dental Exam: Dr. Enis Gash q 6 mons Last Eye Exam:Dr. Thom Chimes nov 2015  [name], MD as PCP - General (Internal Medicine)  Allergies: No Known Allergies Medical History:  Past Medical History  Diagnosis Date  . 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 MVP (mitral valve prolapse)     Antibiotics required prior to dental procedures  . Abnormal FSH level 03/2012    value 29.9   Surgical History:  Past Surgical History  Procedure Laterality Date  . Endometrial ablation  10/2006    NOVASURE (FOCAL SIMPLE HYPERPLASIA AT D&C W NOVASURE)  . Augmentation mammaplasty    . Cesarean section    . Dilation and curettage of uterus     Family History:  Family History  Problem Relation Age of Onset  . Diabetes Mother    Social History:  History  Substance Use Topics  . Smoking status: Former Research scientist (life sciences)  . Smokeless tobacco: Never Used  . Alcohol Use: Yes     Comment: occassionally    Review of Systems: Review of Systems  Constitutional: Negative.   HENT: Negative.   Eyes: Negative.   Respiratory: Negative.   Cardiovascular: Negative.   Gastrointestinal: Negative.   Genitourinary: Negative.   Musculoskeletal: Negative.   Skin: Negative.   Neurological: Negative.   Endo/Heme/Allergies: Negative.   Psychiatric/Behavioral: Negative.      Physical Exam: Estimated  body mass index is 24.87 kg/(m^2) as calculated from the following:   Height as of this encounter: 5\' 6"  (1.676 m).   Weight as of this encounter: 154 lb (69.854 kg). BP 122/68 mmHg  Pulse 64  Temp(Src) 97.9 F (36.6 C)  Resp 16  Ht 5\' 6"  (1.676 m)  Wt 154 lb (69.854 kg)  BMI 24.87 kg/m2 General Appearance: Well nourished, in no apparent distress.  Eyes: PERRLA, EOMs, conjunctiva no  swelling or erythema, normal fundi and vessels.  Sinuses: No Frontal/maxillary tenderness  ENT/Mouth: Ext aud canals clear, normal light reflex with TMs without erythema, bulging. Good dentition. No erythema, swelling, or exudate on post pharynx. Tonsils not swollen or erythematous. Hearing normal.  Neck: Supple, thyroid normal. No bruits  Respiratory: Respiratory effort normal, BS equal bilaterally without rales, rhonchi, wheezing or stridor.  Cardio: RRR without murmurs, rubs or gallops. Brisk peripheral pulses without edema.  Chest: symmetric, with normal excursions and percussion.  Breasts: defer.  Abdomen: Soft, nontender, no guarding, rebound, hernias, masses, or organomegaly. .  Lymphatics: Non tender without lymphadenopathy.  Genitourinary: defer Musculoskeletal: Full ROM all peripheral extremities,5/5 strength, and normal gait.  Skin: Warm, dry without rashes, lesions, ecchymosis. Neuro: Cranial nerves intact, reflexes equal bilaterally. Normal muscle tone, no cerebellar symptoms. Sensation intact.  Psych: Awake and oriented X 3, normal affect, Insight and Judgment appropriate.   EKG: defer normal 2014, will get every other year   Vicie Mutters 10:29 AM [facility]

## 2014-03-10 NOTE — Patient Instructions (Addendum)
Your Vitamin D is not in range, we want that around 60-80. Please make sure that you are taking your Vitamin D as directed. It is very important as a natural antiinflammatory helping hair, skin, and nails, as well as reducing stroke and heart attack risk. It helps your bones and helps with mood. It also decreases numerous cancer risks so please take it as directed. GET ONE 5000IU DAILY!!  B12 is low end of normal, add sublingual B12 can be 500-1082mcg daily. The sublingual is better than the pills because likely you are not absorbing in your intestines well so the sublingual gets absorbed through your mouth and ensures you get the amount you need. Will help with energy, memory/concentration, decrease nerve pain, and help with weight loss. B12 is water soluble vitamin so you can not over dose on it, and anything you do not use will be sent out in your urine.   Can try melatonin 5mg -15 mg at night for sleep, can also do benadryl 25-50mg  at night for sleep.  If this does not help we can try prescription medication.  Also here is some information about good sleep hygiene.   Insomnia Insomnia is frequent trouble falling and/or staying asleep. Insomnia can be a long term problem or a short term problem. Both are common. Insomnia can be a short term problem when the wakefulness is related to a certain stress or worry. Long term insomnia is often related to ongoing stress during waking hours and/or poor sleeping habits. Overtime, sleep deprivation itself can make the problem worse. Every little thing feels more severe because you are overtired and your ability to cope is decreased. CAUSES   Stress, anxiety, and depression.  Poor sleeping habits.  Distractions such as TV in the bedroom.  Naps close to bedtime.  Engaging in emotionally charged conversations before bed.  Technical reading before sleep.  Alcohol and other sedatives. They may make the problem worse. They can hurt normal sleep patterns and  normal dream activity.  Stimulants such as caffeine for several hours prior to bedtime.  Pain syndromes and shortness of breath can cause insomnia.  Exercise late at night.  Changing time zones may cause sleeping problems (jet lag). It is sometimes helpful to have someone observe your sleeping patterns. They should look for periods of not breathing during the night (sleep apnea). They should also look to see how long those periods last. If you live alone or observers are uncertain, you can also be observed at a sleep clinic where your sleep patterns will be professionally monitored. Sleep apnea requires a checkup and treatment. Give your caregivers your medical history. Give your caregivers observations your family has made about your sleep.  SYMPTOMS   Not feeling rested in the morning.  Anxiety and restlessness at bedtime.  Difficulty falling and staying asleep. TREATMENT   Your caregiver may prescribe treatment for an underlying medical disorders. Your caregiver can give advice or help if you are using alcohol or other drugs for self-medication. Treatment of underlying problems will usually eliminate insomnia problems.  Medications can be prescribed for short time use. They are generally not recommended for lengthy use.  Over-the-counter sleep medicines are not recommended for lengthy use. They can be habit forming.  You can promote easier sleeping by making lifestyle changes such as:  Using relaxation techniques that help with breathing and reduce muscle tension.  Exercising earlier in the day.  Changing your diet and the time of your last meal. No night time snacks.  Establish a regular time to go to bed.  Counseling can help with stressful problems and worry.  Soothing music and white noise may be helpful if there are background noises you cannot remove.  Stop tedious detailed work at least one hour before bedtime. HOME CARE INSTRUCTIONS   Keep a diary. Inform your  caregiver about your progress. This includes any medication side effects. See your caregiver regularly. Take note of:  Times when you are asleep.  Times when you are awake during the night.  The quality of your sleep.  How you feel the next day. This information will help your caregiver care for you.  Get out of bed if you are still awake after 15 minutes. Read or do some quiet activity. Keep the lights down. Wait until you feel sleepy and go back to bed.  Keep regular sleeping and waking hours. Avoid naps.  Exercise regularly.  Avoid distractions at bedtime. Distractions include watching television or engaging in any intense or detailed activity like attempting to balance the household checkbook.  Develop a bedtime ritual. Keep a familiar routine of bathing, brushing your teeth, climbing into bed at the same time each night, listening to soothing music. Routines increase the success of falling to sleep faster.  Use relaxation techniques. This can be using breathing and muscle tension release routines. It can also include visualizing peaceful scenes. You can also help control troubling or intruding thoughts by keeping your mind occupied with boring or repetitive thoughts like the old concept of counting sheep. You can make it more creative like imagining planting one beautiful flower after another in your backyard garden.  During your day, work to eliminate stress. When this is not possible use some of the previous suggestions to help reduce the anxiety that accompanies stressful situations. MAKE SURE YOU:   Understand these instructions.  Will watch your condition.  Will get help right away if you are not doing well or get worse. Document Released: 03/03/2000 Document Revised: 05/29/2011 Document Reviewed: 04/03/2007 [facility] Patient Information 2015 [place], [place]. This information is not intended to replace advice given to you by your health care provider. Make sure you discuss  any questions you have with your health care provider.

## 2014-03-11 LAB — URINALYSIS, ROUTINE W REFLEX MICROSCOPIC
Bilirubin Urine: NEGATIVE
GLUCOSE, UA: NEGATIVE mg/dL
HGB URINE DIPSTICK: NEGATIVE
KETONES UR: NEGATIVE mg/dL
LEUKOCYTES UA: NEGATIVE
Nitrite: NEGATIVE
PROTEIN: NEGATIVE mg/dL
Specific Gravity, Urine: 1.023 (ref 1.005–1.030)
Urobilinogen, UA: 0.2 mg/dL (ref 0.0–1.0)
pH: 6 (ref 5.0–8.0)

## 2014-03-11 LAB — VITAMIN D 25 HYDROXY (VIT D DEFICIENCY, FRACTURES): VIT D 25 HYDROXY: 26 ng/mL — AB (ref 30–100)

## 2014-03-11 LAB — MICROALBUMIN / CREATININE URINE RATIO
Creatinine, Urine: 236.4 mg/dL
Microalb Creat Ratio: 3.8 mg/g (ref 0.0–30.0)
Microalb, Ur: 0.9 mg/dL (ref <2.0)

## 2014-03-11 LAB — TSH: TSH: 1.674 u[IU]/mL (ref 0.350–4.500)

## 2014-03-11 LAB — VITAMIN B12: Vitamin B-12: 283 pg/mL (ref 211–911)

## 2014-05-28 ENCOUNTER — Other Ambulatory Visit: Payer: Self-pay | Admitting: Internal Medicine

## 2014-06-09 ENCOUNTER — Other Ambulatory Visit: Payer: Self-pay

## 2014-06-09 DIAGNOSIS — Z9289 Personal history of other medical treatment: Secondary | ICD-10-CM

## 2014-07-13 ENCOUNTER — Ambulatory Visit
Admission: RE | Admit: 2014-07-13 | Discharge: 2014-07-13 | Disposition: A | Discharge status: Home / Self Care | Payer: BLUE CROSS/BLUE SHIELD | Source: Ambulatory Visit

## 2014-07-13 ENCOUNTER — Other Ambulatory Visit: Payer: Self-pay

## 2014-07-13 DIAGNOSIS — Z9289 Personal history of other medical treatment: Secondary | ICD-10-CM

## 2014-08-06 ENCOUNTER — Ambulatory Visit (INDEPENDENT_AMBULATORY_CARE_PROVIDER_SITE_OTHER): Payer: BLUE CROSS/BLUE SHIELD | Admitting: Gynecology

## 2014-08-06 ENCOUNTER — Encounter: Payer: Self-pay | Admitting: Gynecology

## 2014-08-06 VITALS — BP 110/70 | Ht 67.0 in | Wt 149.0 lb

## 2014-08-06 DIAGNOSIS — Z01419 Encounter for gynecological examination (general) (routine) without abnormal findings: Secondary | ICD-10-CM | POA: Diagnosis not present

## 2014-08-06 DIAGNOSIS — N951 Menopausal and female climacteric states: Secondary | ICD-10-CM | POA: Diagnosis not present

## 2014-08-06 LAB — TSH: TSH: 1.809 u[IU]/mL (ref 0.350–4.500)

## 2014-08-06 NOTE — Patient Instructions (Signed)
You may obtain a copy of any labs that were done today by logging onto MyChart as outlined in the instructions provided with your AVS (after visit summary). The office will not call with normal lab results but certainly if there are any significant abnormalities then we will contact you.   Health Maintenance, Female A healthy lifestyle and preventative care can promote health and wellness.  Maintain regular health, dental, and eye exams.  Eat a healthy diet. Foods like vegetables, fruits, whole grains, low-fat dairy products, and lean protein foods contain the nutrients you need without too many calories. Decrease your intake of foods high in solid fats, added sugars, and salt. Get information about a proper diet from your caregiver, if necessary.  Regular physical exercise is one of the most important things you can do for your health. Most adults should get at least 150 minutes of moderate-intensity exercise (any activity that increases your heart rate and causes you to sweat) each week. In addition, most adults need muscle-strengthening exercises on 2 or more days a week.   Maintain a healthy weight. The body mass index (BMI) is a screening tool to identify possible weight problems. It provides an estimate of body fat based on height and weight. Your caregiver can help determine your BMI, and can help you achieve or maintain a healthy weight. For adults 20 years and older:  A BMI below 18.5 is considered underweight.  A BMI of 18.5 to 24.9 is normal.  A BMI of 25 to 29.9 is considered overweight.  A BMI of 30 and above is considered obese.  Maintain normal blood lipids and cholesterol by exercising and minimizing your intake of saturated fat. Eat a balanced diet with plenty of fruits and vegetables. Blood tests for lipids and cholesterol should begin at age 61 and be repeated every 5 years. If your lipid or cholesterol levels are high, you are over 50, or you are a high risk for heart  disease, you may need your cholesterol levels checked more frequently.Ongoing high lipid and cholesterol levels should be treated with medicines if diet and exercise are not effective.  If you smoke, find out from your caregiver how to quit. If you do not use tobacco, do not start.  Lung cancer screening is recommended for adults aged 33 80 years who are at high risk for developing lung cancer because of a history of smoking. Yearly low-dose computed tomography (CT) is recommended for people who have at least a 30-pack-year history of smoking and are a current smoker or have quit within the past 15 years. A pack year of smoking is smoking an average of 1 pack of cigarettes a day for 1 year (for example: 1 pack a day for 30 years or 2 packs a day for 15 years). Yearly screening should continue until the smoker has stopped smoking for at least 15 years. Yearly screening should also be stopped for people who develop a health problem that would prevent them from having lung cancer treatment.  If you are pregnant, do not drink alcohol. If you are breastfeeding, be very cautious about drinking alcohol. If you are not pregnant and choose to drink alcohol, do not exceed 1 drink per day. One drink is considered to be 12 ounces (355 mL) of beer, 5 ounces (148 mL) of wine, or 1.5 ounces (44 mL) of liquor.  Avoid use of street drugs. Do not share needles with anyone. Ask for help if you need support or instructions about stopping  the use of drugs.  High blood pressure causes heart disease and increases the risk of stroke. Blood pressure should be checked at least every 1 to 2 years. Ongoing high blood pressure should be treated with medicines, if weight loss and exercise are not effective.  If you are 59 to 49 years old, ask your caregiver if you should take aspirin to prevent strokes.  Diabetes screening involves taking a blood sample to check your fasting blood sugar level. This should be done once every 3  years, after age 91, if you are within normal weight and without risk factors for diabetes. Testing should be considered at a younger age or be carried out more frequently if you are overweight and have at least 1 risk factor for diabetes.  Breast cancer screening is essential preventative care for women. You should practice "breast self-awareness." This means understanding the normal appearance and feel of your breasts and may include breast self-examination. Any changes detected, no matter how small, should be reported to a caregiver. Women in their 66s and 30s should have a clinical breast exam (CBE) by a caregiver as part of a regular health exam every 1 to 3 years. After age 101, women should have a CBE every year. Starting at age 100, women should consider having a mammogram (breast X-ray) every year. Women who have a family history of breast cancer should talk to their caregiver about genetic screening. Women at a high risk of breast cancer should talk to their caregiver about having an MRI and a mammogram every year.  Breast cancer gene (BRCA)-related cancer risk assessment is recommended for women who have family members with BRCA-related cancers. BRCA-related cancers include breast, ovarian, tubal, and peritoneal cancers. Having family members with these cancers may be associated with an increased risk for harmful changes (mutations) in the breast cancer genes BRCA1 and BRCA2. Results of the assessment will determine the need for genetic counseling and BRCA1 and BRCA2 testing.  The Pap test is a screening test for cervical cancer. Women should have a Pap test starting at age 57. Between ages 25 and 35, Pap tests should be repeated every 2 years. Beginning at age 37, you should have a Pap test every 3 years as long as the past 3 Pap tests have been normal. If you had a hysterectomy for a problem that was not cancer or a condition that could lead to cancer, then you no longer need Pap tests. If you are  between ages 50 and 76, and you have had normal Pap tests going back 10 years, you no longer need Pap tests. If you have had past treatment for cervical cancer or a condition that could lead to cancer, you need Pap tests and screening for cancer for at least 20 years after your treatment. If Pap tests have been discontinued, risk factors (such as a new sexual partner) need to be reassessed to determine if screening should be resumed. Some women have medical problems that increase the chance of getting cervical cancer. In these cases, your caregiver may recommend more frequent screening and Pap tests.  The human papillomavirus (HPV) test is an additional test that may be used for cervical cancer screening. The HPV test looks for the virus that can cause the cell changes on the cervix. The cells collected during the Pap test can be tested for HPV. The HPV test could be used to screen women aged 44 years and older, and should be used in women of any age  who have unclear Pap test results. After the age of 68, women should have HPV testing at the same frequency as a Pap test.  Colorectal cancer can be detected and often prevented. Most routine colorectal cancer screening begins at the age of 29 and continues through age 13. However, your caregiver may recommend screening at an earlier age if you have risk factors for colon cancer. On a yearly basis, your caregiver may provide home test kits to check for hidden blood in the stool. Use of a small camera at the end of a tube, to directly examine the colon (sigmoidoscopy or colonoscopy), can detect the earliest forms of colorectal cancer. Talk to your caregiver about this at age 46, when routine screening begins. Direct examination of the colon should be repeated every 5 to 10 years through age 56, unless early forms of pre-cancerous polyps or small growths are found.  Hepatitis C blood testing is recommended for all people born from [birth year] through 1965 and any  individual with known risks for hepatitis C.  Practice safe sex. Use condoms and avoid high-risk sexual practices to reduce the spread of sexually transmitted infections (STIs). Sexually active women aged 24 and younger should be checked for Chlamydia, which is a common sexually transmitted infection. Older women with new or multiple partners should also be tested for Chlamydia. Testing for other STIs is recommended if you are sexually active and at increased risk.  Osteoporosis is a disease in which the bones lose minerals and strength with aging. This can result in serious bone fractures. The risk of osteoporosis can be identified using a bone density scan. Women ages 51 and over and women at risk for fractures or osteoporosis should discuss screening with their caregivers. Ask your caregiver whether you should be taking a calcium supplement or vitamin D to reduce the rate of osteoporosis.  Menopause can be associated with physical symptoms and risks. Hormone replacement therapy is available to decrease symptoms and risks. You should talk to your caregiver about whether hormone replacement therapy is right for you.  Use sunscreen. Apply sunscreen liberally and repeatedly throughout the day. You should seek shade when your shadow is shorter than you. Protect yourself by wearing long sleeves, pants, a wide-brimmed hat, and sunglasses year round, whenever you are outdoors.  Notify your caregiver of new moles or changes in moles, especially if there is a change in shape or color. Also notify your caregiver if a mole is larger than the size of a pencil eraser.  Stay current with your immunizations. Document Released: 09/19/2010 Document Revised: 07/01/2012 Document Reviewed: 09/19/2010 [facility] Patient Information 2014 [place].

## 2014-08-06 NOTE — Progress Notes (Signed)
Mary [name] 07-May-1965 [id number]        49 y.o.  G1P1001 for annual exam.  Several issues noted below.  Past medical history,surgical history, problem list, medications, allergies, family history and social history were all reviewed and documented as reviewed in the EPIC chart.  ROS:  Performed with pertinent positives and negatives included in the history, assessment and plan.   Additional significant findings :  none   Exam: [name] Counsellor Vitals:   08/06/14 1016  BP: 110/70  Height: 5\' 7"  (1.702 m)  Weight: 149 lb (67.586 kg)   General appearance:  Normal affect, orientation and appearance. Skin: Grossly normal HEENT: Without gross lesions.  No cervical or supraclavicular adenopathy. Thyroid normal.  Lungs:  Clear without wheezing, rales or rhonchi Cardiac: RR, without RMG Abdominal:  Soft, nontender, without masses, guarding, rebound, organomegaly or hernia Breasts:  Examined lying and sitting without masses, retractions, discharge or axillary adenopathy.  Bilateral implants noted Pelvic:  Ext/BUS/vagina normal  Cervix normal  Uterus anteverted, normal size, shape and contour, midline and mobile nontender   Adnexa  Without masses or tenderness    Anus and perineum  Normal   Rectovaginal  Normal sphincter tone without palpated masses or tenderness.    Assessment/Plan:  49 y.o. G46P1001 female for annual exam without menses, vasectomy birth control.   1. Menopausal symptoms. Patient remains amenorrheic since her NovaSure endometrial ablation. Started having some hot flashes 2 years ago with FSH 29.9. Hot flashes have significantly increased over the past 6 months or so. Will recheck [place] and TSH now. Reviewed the perimenopause options to include OTC soy based products and HRT.  She is on Wellbutrin. I reviewed the WHI study and risks to include increased risk of stroke heart attack DVT and breast cancer. Benefits is for symptom relief, bone, possible cardiovascular protection  also reviewed. This point patient is not interested and would prefer just to monitor and try OTC products. Will follow up it becomes an issue. Will call if she does any bleeding. 2. Pap smear/HPV negative 2014.  No Pap smear done today. No history of significant abnormal Pap smears. Plan repeat at 3-5 year interval per current screening guidelines. 3. Mammography 06/2014. Continue with annual mammography. SBE monthly reviewed. 4. Health maintenance. Patient has her routine blood work done at her primary physician's office. Follow up 1 year, sooner as needed.     Anastasio Auerbach MD, 10:39 AM 08/06/2014

## 2014-08-07 LAB — URINALYSIS W MICROSCOPIC + REFLEX CULTURE
BACTERIA UA: NONE SEEN
Bilirubin Urine: NEGATIVE
CASTS: NONE SEEN
Crystals: NONE SEEN
GLUCOSE, UA: NEGATIVE mg/dL
HGB URINE DIPSTICK: NEGATIVE
Ketones, ur: NEGATIVE mg/dL
LEUKOCYTES UA: NEGATIVE
NITRITE: NEGATIVE
PH: 6 (ref 5.0–8.0)
PROTEIN: NEGATIVE mg/dL
Specific Gravity, Urine: 1.021 (ref 1.005–1.030)
Squamous Epithelial / LPF: NONE SEEN
Urobilinogen, UA: 0.2 mg/dL (ref 0.0–1.0)

## 2014-08-07 LAB — FOLLICLE STIMULATING HORMONE: FSH: 94 m[IU]/mL

## 2014-10-27 ENCOUNTER — Telehealth: Payer: Self-pay | Admitting: *Deleted

## 2014-10-27 MED ORDER — ALPRAZOLAM 1 MG PO TABS: ORAL_TABLET | ORAL | Status: DC

## 2014-10-27 NOTE — Telephone Encounter (Signed)
Patient called and states she is going to be flying and requested an RX for anxiety.  OK to send in an RX for Xanax, per Dr Melford Aase.

## 2014-12-23 ENCOUNTER — Other Ambulatory Visit: Payer: Self-pay | Admitting: Internal Medicine

## 2015-01-07 ENCOUNTER — Ambulatory Visit (INDEPENDENT_AMBULATORY_CARE_PROVIDER_SITE_OTHER): Payer: BLUE CROSS/BLUE SHIELD | Admitting: Gynecology

## 2015-01-07 ENCOUNTER — Encounter: Payer: Self-pay | Admitting: Gynecology

## 2015-01-07 VITALS — BP 124/70

## 2015-01-07 DIAGNOSIS — R300 Dysuria: Secondary | ICD-10-CM | POA: Diagnosis not present

## 2015-01-07 DIAGNOSIS — N898 Other specified noninflammatory disorders of vagina: Secondary | ICD-10-CM | POA: Diagnosis not present

## 2015-01-07 LAB — URINALYSIS W MICROSCOPIC + REFLEX CULTURE
Bilirubin Urine: NEGATIVE
Crystals: NONE SEEN [HPF]
Glucose, UA: NEGATIVE
Nitrite: NEGATIVE
PH: 7 (ref 5.0–8.0)
SPECIFIC GRAVITY, URINE: 1.02 (ref 1.001–1.035)
YEAST: NONE SEEN [HPF]

## 2015-01-07 LAB — WET PREP FOR TRICH, YEAST, CLUE
CLUE CELLS WET PREP: NONE SEEN
TRICH WET PREP: NONE SEEN

## 2015-01-07 MED ORDER — FLUCONAZOLE 150 MG PO TABS: 150.0000 mg | ORAL_TABLET | Freq: Once | ORAL | Status: DC

## 2015-01-07 MED ORDER — SULFAMETHOXAZOLE-TRIMETHOPRIM 800-160 MG PO TABS: 1.0000 | ORAL_TABLET | Freq: Two times a day (BID) | ORAL | Status: DC

## 2015-01-07 NOTE — Patient Instructions (Signed)
Take the oral antibiotic twice daily for 3 days for the urinary tract infection. Take the Diflucan pill the first day and the last day of your oral antibiotics Follow up if your symptoms persist, worsen or recur

## 2015-01-07 NOTE — Progress Notes (Signed)
Mary [name] August 28, 1965 [id number]        49 y.o.  G1P1001 presents with 3 days of vaginal discharge with odor. Also end stream dysuria. No fever chills nausea vomiting diarrhea constipation. No frequency or urgency. No low back pain.  Past medical history,surgical history, problem list, medications, allergies, family history and social history were all reviewed and documented in the EPIC chart.  Directed ROS with pertinent positives and negatives documented in the history of present illness/assessment and plan.  Exam: [name] assistant Filed Vitals:   01/07/15 0857  BP: 124/70   General appearance:  Normal Spine straight without CVA tenderness Abdomen soft nontender without masses guarding rebound Pelvic external BUS vagina with white discharge. Cervix normal. Uterus normal size midline mobile nontender. Adnexa without masses or tenderness.  Assessment/Plan:  49 y.o. G1P1001 with above history and exam. Wet preps positive for yeast. Urinalysis appears contaminated but suspicious for UTI. Will cover with Septra DS 1 by mouth twice a day 3 days and Diflucan 150 mg tablet 1 at the beginning of her antibiotics and the other at the end. Follow up if symptoms persist, worsen or recur.    Anastasio Auerbach MD, 9:34 AM 01/07/2015    Presents having

## 2015-01-07 NOTE — Addendum Note (Signed)
Addended by: Nelva Nay on: 01/07/2015 09:50 AM   Modules accepted: Orders

## 2015-01-08 ENCOUNTER — Telehealth: Payer: Self-pay | Admitting: *Deleted

## 2015-01-08 LAB — URINE CULTURE: Colony Count: 6000

## 2015-01-08 MED ORDER — FLUCONAZOLE 150 MG PO TABS: 150.0000 mg | ORAL_TABLET | Freq: Once | ORAL | Status: DC

## 2015-01-08 NOTE — Telephone Encounter (Signed)
Pt was seen on 01/07/15 treated for yeast infection and UTI told 2 tablets of diflucan was going to be sent to the pharmacy. Per note "Diflucan 150 mg tablet 1 at the beginning of her antibiotics and the other at the end" only 1 pills was sent, I will sent another per note on OV

## 2015-03-18 ENCOUNTER — Ambulatory Visit (INDEPENDENT_AMBULATORY_CARE_PROVIDER_SITE_OTHER): Payer: BLUE CROSS/BLUE SHIELD | Admitting: Physician Assistant

## 2015-03-18 ENCOUNTER — Encounter: Payer: Self-pay | Admitting: Physician Assistant

## 2015-03-18 VITALS — BP 110/64 | HR 77 | Temp 98.1°F | Resp 16 | Ht 67.0 in | Wt 150.2 lb

## 2015-03-18 DIAGNOSIS — D509 Iron deficiency anemia, unspecified: Secondary | ICD-10-CM

## 2015-03-18 DIAGNOSIS — E538 Deficiency of other specified B group vitamins: Secondary | ICD-10-CM | POA: Diagnosis not present

## 2015-03-18 DIAGNOSIS — E559 Vitamin D deficiency, unspecified: Secondary | ICD-10-CM

## 2015-03-18 DIAGNOSIS — Z1212 Encounter for screening for malignant neoplasm of rectum: Secondary | ICD-10-CM

## 2015-03-18 DIAGNOSIS — Z0001 Encounter for general adult medical examination with abnormal findings: Secondary | ICD-10-CM

## 2015-03-18 DIAGNOSIS — Z79899 Other long term (current) drug therapy: Secondary | ICD-10-CM | POA: Diagnosis not present

## 2015-03-18 DIAGNOSIS — R6889 Other general symptoms and signs: Secondary | ICD-10-CM

## 2015-03-18 DIAGNOSIS — F418 Other specified anxiety disorders: Secondary | ICD-10-CM | POA: Diagnosis not present

## 2015-03-18 DIAGNOSIS — I341 Nonrheumatic mitral (valve) prolapse: Secondary | ICD-10-CM | POA: Diagnosis not present

## 2015-03-18 DIAGNOSIS — N951 Menopausal and female climacteric states: Secondary | ICD-10-CM

## 2015-03-18 DIAGNOSIS — E785 Hyperlipidemia, unspecified: Secondary | ICD-10-CM | POA: Diagnosis not present

## 2015-03-18 DIAGNOSIS — Z1389 Encounter for screening for other disorder: Secondary | ICD-10-CM

## 2015-03-18 LAB — LIPID PANEL
CHOLESTEROL: 237 mg/dL — AB (ref 125–200)
HDL: 107 mg/dL (ref >=46)
LDL Cholesterol: 116 mg/dL (ref <130)
Total CHOL/HDL Ratio: 2.2 Ratio (ref <=5.0)
Triglycerides: 71 mg/dL (ref <150)
VLDL: 14 mg/dL (ref <30)

## 2015-03-18 LAB — CBC WITH DIFFERENTIAL/PLATELET
BASOS ABS: 0 10*3/uL (ref 0.0–0.1)
Basophils Relative: 1 % (ref 0–1)
Eosinophils Absolute: 0.1 10*3/uL (ref 0.0–0.7)
Eosinophils Relative: 2 % (ref 0–5)
HEMATOCRIT: 37.3 % (ref 36.0–46.0)
HEMOGLOBIN: 12.4 g/dL (ref 12.0–15.0)
LYMPHS ABS: 1.3 10*3/uL (ref 0.7–4.0)
LYMPHS PCT: 39 % (ref 12–46)
MCH: 31.2 pg (ref 26.0–34.0)
MCHC: 33.2 g/dL (ref 30.0–36.0)
MCV: 93.7 fL (ref 78.0–100.0)
MPV: 9.5 fL (ref 8.6–12.4)
Monocytes Absolute: 0.3 10*3/uL (ref 0.1–1.0)
Monocytes Relative: 8 % (ref 3–12)
NEUTROS ABS: 1.7 10*3/uL (ref 1.7–7.7)
Neutrophils Relative %: 50 % (ref 43–77)
Platelets: 215 10*3/uL (ref 150–400)
RBC: 3.98 MIL/uL (ref 3.87–5.11)
RDW: 12.5 % (ref 11.5–15.5)
WBC: 3.4 10*3/uL — ABNORMAL LOW (ref 4.0–10.5)

## 2015-03-18 LAB — BASIC METABOLIC PANEL WITH GFR
BUN: 15 mg/dL (ref 7–25)
CO2: 26 mmol/L (ref 20–31)
Calcium: 9.3 mg/dL (ref 8.6–10.2)
Chloride: 105 mmol/L (ref 98–110)
Creat: 0.7 mg/dL (ref 0.50–1.10)
GFR, Est African American: >89 mL/min (ref >=60)
GFR, Est Non African American: >89 mL/min (ref >=60)
GLUCOSE: 78 mg/dL (ref 65–99)
Potassium: 4.1 mmol/L (ref 3.5–5.3)
Sodium: 140 mmol/L (ref 135–146)

## 2015-03-18 LAB — HEPATIC FUNCTION PANEL
ALBUMIN: 4.6 g/dL (ref 3.6–5.1)
ALT: 10 U/L (ref 6–29)
AST: 18 U/L (ref 10–35)
Alkaline Phosphatase: 70 U/L (ref 33–115)
BILIRUBIN INDIRECT: 0.5 mg/dL (ref 0.2–1.2)
Bilirubin, Direct: 0.1 mg/dL (ref <=0.2)
Total Bilirubin: 0.6 mg/dL (ref 0.2–1.2)
Total Protein: 7.1 g/dL (ref 6.1–8.1)

## 2015-03-18 LAB — IRON AND TIBC
%SAT: 30 % (ref 11–50)
Iron: 95 ug/dL (ref 40–190)
TIBC: 317 ug/dL (ref 250–450)
UIBC: 222 ug/dL (ref 125–400)

## 2015-03-18 LAB — MAGNESIUM: Magnesium: 2 mg/dL (ref 1.5–2.5)

## 2015-03-18 MED ORDER — BUPROPION HCL ER (XL) 300 MG PO TB24: ORAL_TABLET | ORAL | Status: DC

## 2015-03-18 MED ORDER — CLONIDINE HCL 0.1 MG PO TABS: ORAL_TABLET | ORAL | Status: DC

## 2015-03-18 NOTE — Progress Notes (Signed)
Complete Physical  Assessment and Plan: 1. Vitamin D deficiency - Vit D  25 hydroxy (rtn osteoporosis monitoring)  2. Hyperlipidemia - CBC with Differential - BASIC METABOLIC PANEL WITH GFR - Hepatic function panel - Lipid panel - TSH - Urinalysis, Routine w reflex microscopic - Microalbumin / creatinine urine ratio  3. Depression with anxiety Depression- continue medications, stress management techniques discussed, increase water, good sleep hygiene discussed, increase exercise, and increase veggies.   4. MVP (mitral valve prolapse) Monitor, no symptoms.   5. B12 deficiency - Vitamin B12  6. Encounter for general adult medical examination with abnormal findings  7. Screening for rectal cancer - POC Hemoccult Bld/Stl (3-Cd Home Screen); Future  8. Medication management - Magnesium  9. Hot flashes Mainly at night, wants to avoid estrogen, will add on clonidine at night, warned of dizziness/dry mouth If this does not help may try lexapro  Discussed med's effects and SE's. Screening labs and tests as requested with regular follow-up as recommended.  HPI  49 y.o. female  presents for a complete physical.   Her blood pressure has been controlled at home, today their BP is BP: 110/64 mmHg She does workout but has not been doing as much for the holidays. She denies chest pain, shortness of breath, dizziness.  She is not on cholesterol medication and denies myalgias. Her cholesterol is at goal. The cholesterol last visit was:  Lab Results  Component Value Date   CHOL 227* 03/10/2014   HDL 97 03/10/2014   LDLCALC 117* 03/10/2014   TRIG 64 03/10/2014   CHOLHDL 2.3 03/10/2014    Last A1C in the office was: 5.2 Patient is on Vitamin D supplement, 5000 IU daily.  Lab Results  Component Value Date   VD25OH 26* 03/10/2014  B12 , not on B12 Lab Results  Component Value Date   VITAMINB12 283 03/10/2014   She is on Wellbutrin 300 XL daily, she states it is helping her for  depression.  Married, has 33 year old daughter at [employer], graduates in May, wants to go into [employer] [facility].  She is going through menopause, has been having hot flashes at night that is interrupting her sleep.   Current Medications:  Current Outpatient Prescriptions on File Prior to Visit  Medication Sig Dispense Refill  . buPROPion (WELLBUTRIN XL) 300 MG 24 hr tablet TAKE 1 TABLET BY MOUTH EVERY DAY FOR MOOD 90 tablet 1  . Cholecalciferol (VITAMIN D PO) Take by mouth.     No current facility-administered medications on file prior to visit.   Health Maintenance:   Immunization History  Administered Date(s) Administered  . Influenza Split 02/02/2014   Tetanus: 2010 Pneumovax: 2000 Prevnar 13: NA Flu vaccine: 2015 not this year  Zostavax: N/A LMP: None, had ablation Pap: 03/2012 Dr. Phineas Real MGM:06/2014 CAT B DEXA: N/A Colonoscopy: due this year, declines at this time.  EGD: N/A Last Dental Exam: Dr. Enis Gash q 6 mons Last Eye Exam:Dr. Thom Chimes nov 2016  [name], MD as PCP - General (Internal Medicine)  Allergies: No Known Allergies Medical History:  Past Medical History  Diagnosis Date  . 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 MVP (mitral valve prolapse)     Antibiotics required prior to dental procedures  . Abnormal FSH level 03/2012    value 29.9  . Vitamin D deficiency   . Hyperlipidemia   . Depression with anxiety   . Anxiety   . Depression  Surgical History:  Past Surgical History  Procedure Laterality Date  . Endometrial ablation  10/2006    NOVASURE (FOCAL SIMPLE HYPERPLASIA AT D&C W NOVASURE)  . Augmentation mammaplasty    . Cesarean section    . Dilation and curettage of uterus     Family History:  Family History  Problem Relation Age of Onset  . Diabetes Mother   . Heart disease Mother   . Hypertension Mother   . Hyperlipidemia Mother   . Hypertension Father   . Hyperlipidemia  Father    Social History:  Social History  Substance Use Topics  . Smoking status: Former Research scientist (life sciences)  . Smokeless tobacco: Never Used  . Alcohol Use: 0.0 oz/week    0 Standard drinks or equivalent per week     Comment: occassionally    Review of Systems: Review of Systems  Constitutional: Negative.   HENT: Negative.   Eyes: Negative.   Respiratory: Negative.   Cardiovascular: Negative.   Gastrointestinal: Negative.   Genitourinary: Negative.   Musculoskeletal: Negative.   Skin: Negative.   Neurological: Negative.   Endo/Heme/Allergies: Negative.   Psychiatric/Behavioral: Negative.    Physical Exam: Estimated body mass index is 23.52 kg/(m^2) as calculated from the following:   Height as of this encounter: 5\' 7"  (1.702 m).   Weight as of this encounter: 150 lb 3.2 oz (68.13 kg). BP 110/64 mmHg  Pulse 77  Temp(Src) 98.1 F (36.7 C) (Temporal)  Resp 16  Ht 5\' 7"  (1.702 m)  Wt 150 lb 3.2 oz (68.13 kg)  BMI 23.52 kg/m2 General Appearance: Well nourished, in no apparent distress.  Eyes: PERRLA, EOMs, conjunctiva no swelling or erythema, normal fundi and vessels.  Sinuses: No Frontal/maxillary tenderness  ENT/Mouth: Ext aud canals clear, normal light reflex with TMs without erythema, bulging. Good dentition. No erythema, swelling, or exudate on post pharynx. Tonsils not swollen or erythematous. Hearing normal.  Neck: Supple, thyroid normal. No bruits  Respiratory: Respiratory effort normal, BS equal bilaterally without rales, rhonchi, wheezing or stridor.  Cardio: RRR without murmurs, rubs or gallops. Brisk peripheral pulses without edema.  Chest: symmetric, with normal excursions and percussion.  Breasts: defer.  Abdomen: Soft, nontender, no guarding, rebound, hernias, masses, or organomegaly. .  Lymphatics: Non tender without lymphadenopathy.  Genitourinary: defer Musculoskeletal: Full ROM all peripheral extremities,5/5 strength, and normal gait.  Skin: Warm, dry without  rashes, lesions, ecchymosis. Neuro: Cranial nerves intact, reflexes equal bilaterally. Normal muscle tone, no cerebellar symptoms. Sensation intact.  Psych: Awake and oriented X 3, normal affect, Insight and Judgment appropriate.   EKG: IRBBB, no ST changes   Vicie Mutters 10:12 AM [facility]

## 2015-03-18 NOTE — Patient Instructions (Addendum)
Try the clonidine at night 1 pill at first can try two Stop if you get dizzy and it can cause dry mouth If this does not work message me  Send back hemoccult card  VAGINAL DRYNESS OVERVIEW  Vaginal dryness, also known as atrophic vaginitis, is a common condition in postmenopausal women. This condition is also common in women who have had both ovaries removed at the time of hysterectomy.   Some women have uncomfortable symptoms of vaginal dryness, such as pain with sex, burning vaginal discomfort or itching, or abnormal vaginal discharge, while others have no symptoms at all.  VAGINAL DRYNESS CAUSES   Estrogen helps to keep the vagina moist and to maintain thickness of the vaginal lining. Vaginal dryness occurs when the ovaries produce a decreased amount of estrogen. This can occur at certain times in a woman's life, and may be permanent or temporary. Times when less estrogen is made include: ?At the time of menopause. ?After surgical removal of the ovaries, chemotherapy, or radiation therapy of the pelvis for cancer. ?After having a baby, particularly in women who breastfeed. ?While using certain medications, such as danazol, medroxyprogesterone (brand names: Provera or DepoProvera), leuprolide (brand name: Lupron), or nafarelin. When these medications are stopped, estrogen production resumes.  Women who smoke cigarettes have been shown to have an increased risk of an earlier menopause transition as compared to non-smokers. Therefore, atrophic vaginitis symptoms may appear at a younger age in this population.  VAGINAL DRYNESS TREATMENT   There are three treatment options for women with vaginal dryness:  Vaginal lubricants and moisturizers - Vaginal lubricants and moisturizers can be purchased without a prescription. These products do not contain any hormones and have virtually no side effects. - Albolene is found in the facial cleanser section at [facility], [facility], or [employer]. It is a large jar  with a blue top. This is the best lubricant for women because it is hypoallergenic. -Natural lubricants, such as olive, avocado or peanut oil, are easily available products that may be used as a lubricant with sex.  -Vaginal moisturizes (eg, Replens, Moist Again, Vagisil, K-Y Silk-E, and Feminease) are formulated to allow water to be retained in the vaginal tissues. Moisturizers are applied into the vagina three times weekly to allow a continued moisturizing effect. These should not be used just before having sex, as they can be irritating.  Vaginal estrogen - Vaginal estrogen is the most effective treatment option for women with vaginal dryness. Vaginal estrogen must be prescribed by a healthcare provider. Very low doses of vaginal estrogen can be used when it is put into the vagina to treat vaginal dryness. A small amount of estrogen is absorbed into the bloodstream, but only about 100 times less than when using estrogen pills or tablets. As a result, there is a much lower risk of side effects, such as blood clots, breast cancer, and heart attack, compared with other estrogen-containing products (birth control pills, menopausal hormone therapy).   Ospemifene - Ospemifene is a prescription medication that is similar to estrogen, but is not estrogen. In the vaginal tissue, it acts similarly to estrogen. In the breast tissue, it acts as an estrogen blocker. It comes in a pill, and is prescribed for women who want to use an estrogen-like medication for vaginal dryness or painful sex associated with vaginal dryness, but prefer not to use a vaginal medication. The medication may cause hot flashes as a side effect. This type of medication may increase the risk of blood clots or  uterine cancer. Further study of ospemifene is needed to evaluate the risk of these complications. This medication has not been tested in women who have had breast cancer or are at a high risk of developing breast cancer.    Sexual  activity - Vaginal estrogen improves vaginal dryness quickly, usually within a few weeks. You may continue to have sex as you treat vaginal dryness because sex itself can help to keep the vaginal tissues healthy. Vaginal intercourse may help the vaginal tissues by keeping them soft and stretchable and preventing the tissues from shrinking.  If sex continues to be painful despite treatment for vaginal dryness, talk to your healthcare provider.

## 2015-03-19 LAB — URINALYSIS, ROUTINE W REFLEX MICROSCOPIC
Bilirubin Urine: NEGATIVE
Glucose, UA: NEGATIVE
HGB URINE DIPSTICK: NEGATIVE
Ketones, ur: NEGATIVE
LEUKOCYTES UA: NEGATIVE
Nitrite: NEGATIVE
PROTEIN: NEGATIVE
Specific Gravity, Urine: 1.012 (ref 1.001–1.035)
pH: 7.5 (ref 5.0–8.0)

## 2015-03-19 LAB — TSH: TSH: 1.804 u[IU]/mL (ref 0.350–4.500)

## 2015-03-19 LAB — VITAMIN D 25 HYDROXY (VIT D DEFICIENCY, FRACTURES): VIT D 25 HYDROXY: 41 ng/mL (ref 30–100)

## 2015-03-19 LAB — MICROALBUMIN / CREATININE URINE RATIO
Creatinine, Urine: 62 mg/dL (ref 20–320)
Microalb, Ur: <0.2 mg/dL

## 2015-03-19 LAB — VITAMIN B12: Vitamin B-12: 317 pg/mL (ref 211–911)

## 2015-03-19 LAB — FERRITIN: Ferritin: 76 ng/mL (ref 10–291)

## 2015-05-10 ENCOUNTER — Telehealth: Payer: Self-pay | Admitting: *Deleted

## 2015-05-10 NOTE — Telephone Encounter (Signed)
I would recommend Vivelle 0.05 mg patch twice weekly and Prometrium 100 mg at bedtime with refills through her annual exam this coming May

## 2015-05-10 NOTE — Telephone Encounter (Signed)
Pt called c/o increased night sweats and hot flashes, pt said you discussed HRT with her at annual in May 2016. Pt would like to start on HRT asked if appointment needed again to discuss? Or can Rx be sent? Please advise

## 2015-05-11 MED ORDER — ESTRADIOL 0.05 MG/24HR TD PTTW: 1.0000 | MEDICATED_PATCH | TRANSDERMAL | Status: DC

## 2015-05-11 MED ORDER — PROGESTERONE MICRONIZED 100 MG PO CAPS: 100.0000 mg | ORAL_CAPSULE | Freq: Every day | ORAL | Status: DC

## 2015-05-11 NOTE — Telephone Encounter (Signed)
Pt informed Rx sent. 

## 2015-05-11 NOTE — Telephone Encounter (Signed)
Left message for pt to call.

## 2015-05-25 ENCOUNTER — Encounter: Payer: Self-pay | Admitting: Physician Assistant

## 2015-05-25 DIAGNOSIS — Z1211 Encounter for screening for malignant neoplasm of colon: Secondary | ICD-10-CM

## 2015-05-26 ENCOUNTER — Encounter: Payer: Self-pay | Admitting: Gastroenterology

## 2015-06-28 ENCOUNTER — Other Ambulatory Visit: Payer: Self-pay

## 2015-06-28 DIAGNOSIS — Z1231 Encounter for screening mammogram for malignant neoplasm of breast: Secondary | ICD-10-CM

## 2015-07-15 ENCOUNTER — Ambulatory Visit
Admission: RE | Admit: 2015-07-15 | Discharge: 2015-07-15 | Disposition: A | Discharge status: Home / Self Care | Payer: BLUE CROSS/BLUE SHIELD | Source: Ambulatory Visit

## 2015-07-15 DIAGNOSIS — Z1231 Encounter for screening mammogram for malignant neoplasm of breast: Secondary | ICD-10-CM

## 2015-07-23 ENCOUNTER — Encounter: Payer: Self-pay | Admitting: Gastroenterology

## 2015-07-23 ENCOUNTER — Ambulatory Visit (AMBULATORY_SURGERY_CENTER): Payer: Self-pay | Admitting: *Deleted

## 2015-07-23 VITALS — Ht 67.0 in | Wt 150.6 lb

## 2015-07-23 DIAGNOSIS — Z1211 Encounter for screening for malignant neoplasm of colon: Secondary | ICD-10-CM

## 2015-07-23 MED ORDER — NA SULFATE-K SULFATE-MG SULF 17.5-3.13-1.6 GM/177ML PO SOLN
1.0000 | Freq: Once | ORAL | Status: DC
Start: 2015-07-23 — End: 2015-07-30

## 2015-07-23 NOTE — Progress Notes (Signed)
No egg or soy allergy known to patient  No issues with past sedation with any surgeries  or procedures, no intubation problems  No diet pills per patient No home 02 use per patient  No blood thinners per patient  Pt denies issues with constipation  emmi declined

## 2015-07-30 ENCOUNTER — Encounter: Payer: Self-pay | Admitting: Gastroenterology

## 2015-07-30 ENCOUNTER — Ambulatory Visit (AMBULATORY_SURGERY_CENTER): Payer: BLUE CROSS/BLUE SHIELD | Admitting: Gastroenterology

## 2015-07-30 VITALS — BP 104/66 | HR 75 | Temp 95.5°F | Resp 15 | Ht 67.0 in | Wt 150.0 lb

## 2015-07-30 DIAGNOSIS — Z1211 Encounter for screening for malignant neoplasm of colon: Secondary | ICD-10-CM

## 2015-07-30 DIAGNOSIS — D122 Benign neoplasm of ascending colon: Secondary | ICD-10-CM | POA: Diagnosis not present

## 2015-07-30 MED ORDER — SODIUM CHLORIDE 0.9 % IV SOLN: 500.0000 mL | INTRAVENOUS | Status: DC

## 2015-07-30 NOTE — Progress Notes (Signed)
Called to room to assist during endoscopic procedure.  Patient ID and intended procedure confirmed with present staff. Received instructions for my participation in the procedure from the performing physician.  

## 2015-07-30 NOTE — Progress Notes (Signed)
Report to PACU, RN, vss, BBS= Clear.  

## 2015-07-30 NOTE — Patient Instructions (Signed)
YOU HAD AN ENDOSCOPIC PROCEDURE TODAY AT THE [facility]:   Refer to the procedure report that was given to you for any specific questions about what was found during the examination.  If the procedure report does not answer your questions, please call your gastroenterologist to clarify.  If you requested that your care partner not be given the details of your procedure findings, then the procedure report has been included in a sealed envelope for you to review at your convenience later.  YOU SHOULD EXPECT: Some feelings of bloating in the abdomen. Passage of more gas than usual.  Walking can help get rid of the air that was put into your GI tract during the procedure and reduce the bloating. If you had a lower endoscopy (such as a colonoscopy or flexible sigmoidoscopy) you may notice spotting of blood in your stool or on the toilet paper. If you underwent a bowel prep for your procedure, you may not have a normal bowel movement for a few days.  Please Note:  You might notice some irritation and congestion in your nose or some drainage.  This is from the oxygen used during your procedure.  There is no need for concern and it should clear up in a day or so.  SYMPTOMS TO REPORT IMMEDIATELY:   Following lower endoscopy (colonoscopy or flexible sigmoidoscopy):  Excessive amounts of blood in the stool  Significant tenderness or worsening of abdominal pains  Swelling of the abdomen that is new, acute  Fever of 100F or higher   For urgent or emergent issues, a gastroenterologist can be reached at any hour by calling [phone].   DIET: Your first meal following the procedure should be a small meal and then it is ok to progress to your normal diet. Heavy or fried foods are harder to digest and may make you feel nauseous or bloated.  Likewise, meals heavy in dairy and vegetables can increase bloating.  Drink plenty of fluids but you should avoid alcoholic beverages for 24  hours.  ACTIVITY:  You should plan to take it easy for the rest of today and you should NOT DRIVE or use heavy machinery until tomorrow (because of the sedation medicines used during the test).    FOLLOW UP: Our staff will call the number listed on your records the next business day following your procedure to check on you and address any questions or concerns that you may have regarding the information given to you following your procedure. If we do not reach you, we will leave a message.  However, if you are feeling well and you are not experiencing any problems, there is no need to return our call.  We will assume that you have returned to your regular daily activities without incident.  If any biopsies were taken you will be contacted by phone or by letter within the next 1-3 weeks.  Please call us at [phone] if you have not heard about the biopsies in 3 weeks.    SIGNATURES/CONFIDENTIALITY: You and/or your care partner have signed paperwork which will be entered into your electronic medical record.  These signatures attest to the fact that that the information above on your After Visit Summary has been reviewed and is understood.  Full responsibility of the confidentiality of this discharge information lies with you and/or your care-partner. 

## 2015-07-30 NOTE — Op Note (Signed)
[facility] Patient Name: Mary [name] Procedure Date: 07/30/2015 10:44 AM MRN: [id number] Endoscopist: Mauri Pole , MD Age: 50 Date of Birth: [birth year] Gender: Female Procedure:                Colonoscopy Indications:              Screening for colorectal malignant neoplasm Medicines:                Monitored Anesthesia Care Procedure:                Pre-Anesthesia Assessment:                           - Prior to the procedure, a History and Physical                            was performed, and patient medications and                            allergies were reviewed. The patient's tolerance of                            previous anesthesia was also reviewed. The risks                            and benefits of the procedure and the sedation                            options and risks were discussed with the patient.                            All questions were answered, and informed consent                            was obtained. Prior Anticoagulants: The patient has                            taken no previous anticoagulant or antiplatelet                            agents. ASA Grade Assessment: I - A normal, healthy                            patient. After reviewing the risks and benefits,                            the patient was deemed in satisfactory condition to                            undergo the procedure.                           After obtaining informed consent, the colonoscope  was passed under direct vision. Throughout the                            procedure, the patient's blood pressure, pulse, and                            oxygen saturations were monitored continuously. The                            Model CF-HQ190L [id number]) scope was introduced                            through the anus and advanced to the the terminal                            ileum, with identification of the appendiceal              orifice and IC valve. The colonoscopy was performed                            without difficulty. The patient tolerated the                            procedure well. The quality of the bowel                            preparation was excellent. The terminal ileum,                            ileocecal valve, appendiceal orifice, and rectum                            were photographed. Scope In: 10:55:54 AM Scope Out: 11:07:20 AM Scope Withdrawal Time: 0 hours 7 minutes 55 seconds  Total Procedure Duration: 0 hours 11 minutes 26 seconds  Findings:                 The perianal and digital rectal examinations were                            normal.                           A 6 mm polyp was found in the ascending colon. The                            polyp was sessile. The polyp was removed with a                            cold snare. Resection and retrieval were complete.                           A few small-mouthed diverticula were found in the  sigmoid colon.                           Non-bleeding internal hemorrhoids were found during                            retroflexion. The hemorrhoids were small. Complications:            No immediate complications. Estimated Blood Loss:     Estimated blood loss: none. Impression:               - One 6 mm polyp in the ascending colon, removed                            with a cold snare. Resected and retrieved.                           - Diverticulosis in the sigmoid colon.                           - Non-bleeding internal hemorrhoids. Recommendation:           - Patient has a contact number available for                            emergencies. The signs and symptoms of potential                            delayed complications were discussed with the                            patient. Return to normal activities tomorrow.                            Written discharge instructions were provided to the                             patient.                           - Resume previous diet.                           - Continue present medications.                           - Await pathology results.                           - Repeat colonoscopy in 5 years for surveillance.                           - Return to GI clinic PRN. Mauri Pole, MD 07/30/2015 11:11:51 AM This report has been signed electronically.

## 2015-08-02 ENCOUNTER — Telehealth: Payer: Self-pay | Admitting: *Deleted

## 2015-08-02 NOTE — Telephone Encounter (Signed)
  Follow up Call-  Call back number 07/30/2015  Post procedure Call Back phone  # [phone]  Permission to leave phone message Yes     Patient questions:  Do you have a fever, pain , or abdominal swelling? No. Pain Score  0 *  Have you tolerated food without any problems? Yes.    Have you been able to return to your normal activities? Yes.    Do you have any questions about your discharge instructions: Diet   No. Medications  No. Follow up visit  No.  Do you have questions or concerns about your Care? No.  Actions: * If pain score is 4 or above: No action needed, pain <4.

## 2015-08-03 ENCOUNTER — Encounter: Payer: Self-pay | Admitting: Gastroenterology

## 2015-08-18 ENCOUNTER — Encounter: Payer: Self-pay | Admitting: Gynecology

## 2015-08-18 ENCOUNTER — Ambulatory Visit (INDEPENDENT_AMBULATORY_CARE_PROVIDER_SITE_OTHER): Payer: BLUE CROSS/BLUE SHIELD | Admitting: Gynecology

## 2015-08-18 VITALS — BP 120/74 | Ht 67.0 in | Wt 152.0 lb

## 2015-08-18 DIAGNOSIS — Z79890 Hormone replacement therapy (postmenopausal): Secondary | ICD-10-CM | POA: Diagnosis not present

## 2015-08-18 DIAGNOSIS — Z01419 Encounter for gynecological examination (general) (routine) without abnormal findings: Secondary | ICD-10-CM

## 2015-08-18 MED ORDER — ESTRADIOL 0.05 MG/24HR TD PTTW: 1.0000 | MEDICATED_PATCH | TRANSDERMAL | Status: DC

## 2015-08-18 MED ORDER — PROGESTERONE MICRONIZED 100 MG PO CAPS: 100.0000 mg | ORAL_CAPSULE | Freq: Every day | ORAL | Status: DC

## 2015-08-18 NOTE — Progress Notes (Signed)
    Mary [name] [birth year] [id number]        50 y.o.  G1P1001  for annual exam.  Doing well.  Past medical history,surgical history, problem list, medications, allergies, family history and social history were all reviewed and documented as reviewed in the EPIC chart.  ROS:  Performed with pertinent positives and negatives included in the history, assessment and plan.   Additional significant findings :  none   Exam: Caryn Bee assistant Filed Vitals:   08/18/15 1508  BP: 120/74  Height: 5\' 7"  (1.702 m)  Weight: 152 lb (68.947 kg)   General appearance:  Normal affect, orientation and appearance. Skin: Grossly normal HEENT: Without gross lesions.  No cervical or supraclavicular adenopathy. Thyroid normal.  Lungs:  Clear without wheezing, rales or rhonchi Cardiac: RR, without RMG Abdominal:  Soft, nontender, without masses, guarding, rebound, organomegaly or hernia Breasts:  Examined lying and sitting without masses, retractions, discharge or axillary adenopathy. Bilateral implants noted Pelvic:  Ext/BUS/vagina normal  Cervix normal  Uterus anteverted, normal size, shape and contour, midline and mobile nontender   Adnexa without masses or tenderness    Anus and perineum normal   Rectovaginal normal sphincter tone without palpated masses or tenderness.    Assessment/Plan:  50 y.o. G9P1001 female for annual exam.   1. Postmenopausal/HRT. Recently started HRT due to worsening disabling hot flashes and sweats in February. Is on Vivelle 0.5 mg patch and Prometrium 100 mg nightly. Has had dramatic relief of her symptoms. Wants to continue. We discussed risks to include increased risk of stroke heart attack DVT and breast cancer. Patient understands and accepts and refill 1 year provided for both. Patient knows to call she does any vaginal bleeding. 2. Mammography 06/2015. Continue with annual mammography when due. SBE monthly reviewed. 3. Pap smear/HPV negative 03/2012. No Pap smear  done today. No history of significant abnormal Pap smear. Plan repeat Pap smear at 5 year interval per current screening guidelines. 4. Colonoscopy 2017. Repeat at their recommended interval. 5. House maintenance. No routine lab work done as patient reports this recently done elsewhere. Follow up 1 year, sooner as needed.   Anastasio Auerbach MD, 3:43 PM 08/18/2015

## 2015-08-18 NOTE — Patient Instructions (Signed)

## 2015-08-30 ENCOUNTER — Other Ambulatory Visit: Payer: Self-pay | Admitting: Gynecology

## 2015-10-18 ENCOUNTER — Encounter: Payer: Self-pay | Admitting: Physician Assistant

## 2015-10-18 ENCOUNTER — Ambulatory Visit (INDEPENDENT_AMBULATORY_CARE_PROVIDER_SITE_OTHER): Payer: BLUE CROSS/BLUE SHIELD | Admitting: Physician Assistant

## 2015-10-18 VITALS — BP 128/66 | HR 92 | Temp 97.7°F | Resp 16 | Ht 67.0 in | Wt 154.6 lb

## 2015-10-18 DIAGNOSIS — I341 Nonrheumatic mitral (valve) prolapse: Secondary | ICD-10-CM | POA: Diagnosis not present

## 2015-10-18 DIAGNOSIS — D509 Iron deficiency anemia, unspecified: Secondary | ICD-10-CM | POA: Diagnosis not present

## 2015-10-18 DIAGNOSIS — E538 Deficiency of other specified B group vitamins: Secondary | ICD-10-CM | POA: Diagnosis not present

## 2015-10-18 DIAGNOSIS — R002 Palpitations: Secondary | ICD-10-CM | POA: Diagnosis not present

## 2015-10-18 DIAGNOSIS — E559 Vitamin D deficiency, unspecified: Secondary | ICD-10-CM | POA: Diagnosis not present

## 2015-10-18 DIAGNOSIS — Z79899 Other long term (current) drug therapy: Secondary | ICD-10-CM | POA: Diagnosis not present

## 2015-10-18 DIAGNOSIS — E785 Hyperlipidemia, unspecified: Secondary | ICD-10-CM | POA: Diagnosis not present

## 2015-10-18 NOTE — Progress Notes (Signed)
Assessment and Plan:   Hypertension -Continue medication, monitor blood pressure at home. Continue DASH diet.  Reminder to go to the ER if any CP, SOB, nausea, dizziness, severe HA, changes vision/speech, left arm numbness and tingling and jaw pain.  Cholesterol -Continue diet and exercise. Check cholesterol.    Prediabetes  -Continue diet and exercise. Check A1C  Vitamin D Def - check level and continue medications.   Palpitations- EKG normal Possibly from medication, stop sudafed check labs, r/u anemia, TSH, dehydration, electrolytes imbalance Increase water, decrease alcohol/caffeine, valsalva maneuvers taught Pending labs if not better with stopping sudafed may add verapamil Go to ER if any worsening symtoms   Continue diet and meds as discussed. Further disposition pending results of labs. Over 30 minutes of exam, counseling, chart review, and critical decision making was performed  Future Appointments Date Time Provider [place]  03/21/2016 10:00 AM Vicie Mutters, PA-C [id number] None     HPI 50 y.o. female  presents for 3 month follow up on hypertension, cholesterol, prediabetes, and vitamin D deficiency.  She has a history of MVP, anxiety, she is on wellbutrin.  She has been complaining of irreg heart beat x Saturday, has had cold before this for 2 weeks sinus congestion, cough with mucus, no fever/chills, has had "fluttering" or skipping beat" off and on, woke her up Saturday morning with her chest very tight. She has been taking off brand tylenol chest and head with decongestant in it. She is on estrogen but takes low dose ASA, no leg swelling/pain.   Her blood pressure has been controlled at home, today their BP is BP: 128/66  She does workout. She denies chest pain, shortness of breath, dizziness.  She is not on cholesterol medication and denies myalgias. Her cholesterol is at goal. The cholesterol last visit was:   Lab Results  Component Value Date   CHOL  237 (H) 03/18/2015   HDL 107 03/18/2015   LDLCALC 116 03/18/2015   TRIG 71 03/18/2015   CHOLHDL 2.2 03/18/2015   Patient is on Vitamin D supplement.   Lab Results  Component Value Date   VD25OH 41 03/18/2015       Current Medications:  Current Outpatient Prescriptions on File Prior to Visit  Medication Sig Dispense Refill  . aspirin 81 MG chewable tablet Chew 81 mg by mouth daily.    Marland Kitchen buPROPion (WELLBUTRIN XL) 300 MG 24 hr tablet TAKE 1 TABLET BY MOUTH EVERY DAY FOR MOOD 90 tablet 1  . Cholecalciferol (VITAMIN D PO) Take by mouth.    . estradiol (VIVELLE-DOT) 0.05 MG/24HR patch Place 1 patch (0.05 mg total) onto the skin 2 (two) times a week. 8 patch 12  . Fexofenadine HCl (ALLEGRA PO) Take 180 mg by mouth daily.    . progesterone (PROMETRIUM) 100 MG capsule Take 1 capsule (100 mg total) by mouth at bedtime. 30 capsule 12   No current facility-administered medications on file prior to visit.    Medical History:  Past Medical History:  Diagnosis Date  . Abnormal FSH level 03/2012   value 29.9  . Allergy   . Anxiety   . Depression with anxiety   . Hyperlipidemia   . MVP (mitral valve prolapse)    Antibiotics required prior to dental procedures  . 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 Vitamin D deficiency    Allergies: No Known Allergies   Review of Systems:  Review of Systems  Constitutional: Positive for  malaise/fatigue. Negative for chills and fever.  HENT: Positive for congestion. Negative for sore throat.   Respiratory: Positive for cough, sputum production and shortness of breath (with coughing). Negative for hemoptysis, wheezing and stridor.   Cardiovascular: Positive for palpitations. Negative for chest pain and leg swelling.  Skin: Negative.   Neurological: Negative.  Negative for headaches.  Psychiatric/Behavioral: Negative.     Family history- Review and unchanged Social history- Review and  unchanged Physical Exam: BP 128/66   Pulse 92   Temp 97.7 F (36.5 C) (Temporal)   Resp 16   Ht 5\' 7"  (1.702 m)   Wt 154 lb 9.6 oz (70.1 kg)   SpO2 95%   BMI 24.21 kg/m  Wt Readings from Last 3 Encounters:  10/18/15 154 lb 9.6 oz (70.1 kg)  08/18/15 152 lb (68.9 kg)  07/30/15 150 lb (68 kg)   General Appearance: Well nourished, in no apparent distress. Eyes: PERRLA, EOMs, conjunctiva no swelling or erythema Sinuses: No Frontal/maxillary tenderness ENT/Mouth: Ext aud canals clear, TMs without erythema, bulging. No erythema, swelling, or exudate on post pharynx.  Tonsils not swollen or erythematous. Hearing normal.  Neck: Supple, thyroid normal.  Respiratory: Respiratory effort normal, BS equal bilaterally without rales, rhonchi, wheezing or stridor.  Cardio: RRR with no MRGs. Brisk peripheral pulses without edema.  Abdomen: Soft, + BS,  Non tender, no guarding, rebound, hernias, masses. Lymphatics: Non tender without lymphadenopathy.  Musculoskeletal: Full ROM, 5/5 strength, Normal gait Skin: Warm, dry without rashes, lesions, ecchymosis. Neuro: Cranial nerves intact. Normal muscle tone, no cerebellar symptoms. Psych: Awake and oriented X 3, normal affect, Insight and Judgment appropriate.    Vicie Mutters, PA-C 3:09 PM [facility]

## 2015-10-19 ENCOUNTER — Ambulatory Visit: Payer: Self-pay | Admitting: Internal Medicine

## 2015-10-19 LAB — HEPATIC FUNCTION PANEL
ALK PHOS: 73 U/L (ref 33–130)
ALT: 11 U/L (ref 6–29)
AST: 18 U/L (ref 10–35)
Albumin: 4.5 g/dL (ref 3.6–5.1)
BILIRUBIN INDIRECT: 0.4 mg/dL (ref 0.2–1.2)
Bilirubin, Direct: 0.1 mg/dL (ref <=0.2)
TOTAL PROTEIN: 6.7 g/dL (ref 6.1–8.1)
Total Bilirubin: 0.5 mg/dL (ref 0.2–1.2)

## 2015-10-19 LAB — VITAMIN D 25 HYDROXY (VIT D DEFICIENCY, FRACTURES): VIT D 25 HYDROXY: 42 ng/mL (ref 30–100)

## 2015-10-19 LAB — IRON AND TIBC
%SAT: 25 % (ref 11–50)
IRON: 80 ug/dL (ref 45–160)
TIBC: 320 ug/dL (ref 250–450)
UIBC: 240 ug/dL (ref 125–400)

## 2015-10-19 LAB — CBC WITH DIFFERENTIAL/PLATELET
Basophils Absolute: 0 cells/uL (ref 0–200)
Basophils Relative: 0 %
Eosinophils Absolute: 58 cells/uL (ref 15–500)
Eosinophils Relative: 1 %
HEMATOCRIT: 38.9 % (ref 35.0–45.0)
HEMOGLOBIN: 13 g/dL (ref 11.7–15.5)
LYMPHS ABS: 1682 {cells}/uL (ref 850–3900)
Lymphocytes Relative: 29 %
MCH: 31.1 pg (ref 27.0–33.0)
MCHC: 33.4 g/dL (ref 32.0–36.0)
MCV: 93.1 fL (ref 80.0–100.0)
MONO ABS: 348 {cells}/uL (ref 200–950)
MPV: 9.5 fL (ref 7.5–12.5)
Monocytes Relative: 6 %
NEUTROS PCT: 64 %
Neutro Abs: 3712 cells/uL (ref 1500–7800)
Platelets: 246 10*3/uL (ref 140–400)
RBC: 4.18 MIL/uL (ref 3.80–5.10)
RDW: 13.2 % (ref 11.0–15.0)
WBC: 5.8 10*3/uL (ref 3.8–10.8)

## 2015-10-19 LAB — LIPID PANEL
Cholesterol: 200 mg/dL (ref 125–200)
HDL: 105 mg/dL (ref >=46)
LDL CALC: 75 mg/dL (ref <130)
Total CHOL/HDL Ratio: 1.9 Ratio (ref <=5.0)
Triglycerides: 99 mg/dL (ref <150)
VLDL: 20 mg/dL (ref <30)

## 2015-10-19 LAB — BASIC METABOLIC PANEL WITH GFR
BUN: 12 mg/dL (ref 7–25)
CO2: 26 mmol/L (ref 20–31)
Calcium: 9.6 mg/dL (ref 8.6–10.4)
Chloride: 106 mmol/L (ref 98–110)
Creat: 0.73 mg/dL (ref 0.50–1.05)
GFR, Est African American: >89 mL/min (ref >=60)
GLUCOSE: 86 mg/dL (ref 65–99)
POTASSIUM: 4.3 mmol/L (ref 3.5–5.3)
Sodium: 142 mmol/L (ref 135–146)

## 2015-10-19 LAB — FERRITIN: Ferritin: 107 ng/mL (ref 10–232)

## 2015-10-19 LAB — MAGNESIUM: Magnesium: 1.9 mg/dL (ref 1.5–2.5)

## 2015-10-19 LAB — TSH: TSH: 1.99 mIU/L

## 2015-10-21 ENCOUNTER — Telehealth: Payer: Self-pay | Admitting: Physician Assistant

## 2015-10-21 ENCOUNTER — Other Ambulatory Visit: Payer: Self-pay | Admitting: Physician Assistant

## 2015-10-21 MED ORDER — PREDNISONE 20 MG PO TABS: ORAL_TABLET | ORAL | 0 refills | Status: DC

## 2015-10-21 MED ORDER — AZITHROMYCIN 250 MG PO TABS: ORAL_TABLET | ORAL | 1 refills | Status: AC

## 2015-10-21 NOTE — Telephone Encounter (Signed)
Patient has had cough, congestion x 2 weeks getting progressively worse has been taking OTC meds, had to stop due to palpitations.  Will send in zpak and very low dose prednisone, continue allergy pill.

## 2015-12-13 ENCOUNTER — Other Ambulatory Visit: Payer: Self-pay | Admitting: Physician Assistant

## 2015-12-13 DIAGNOSIS — F418 Other specified anxiety disorders: Secondary | ICD-10-CM

## 2016-03-21 ENCOUNTER — Encounter: Payer: Self-pay | Admitting: Physician Assistant

## 2016-03-21 ENCOUNTER — Ambulatory Visit (INDEPENDENT_AMBULATORY_CARE_PROVIDER_SITE_OTHER): Payer: BLUE CROSS/BLUE SHIELD | Admitting: Physician Assistant

## 2016-03-21 VITALS — BP 118/70 | HR 66 | Temp 97.5°F | Resp 16 | Ht 67.5 in | Wt 161.0 lb

## 2016-03-21 DIAGNOSIS — F325 Major depressive disorder, single episode, in full remission: Secondary | ICD-10-CM

## 2016-03-21 DIAGNOSIS — E538 Deficiency of other specified B group vitamins: Secondary | ICD-10-CM

## 2016-03-21 DIAGNOSIS — D509 Iron deficiency anemia, unspecified: Secondary | ICD-10-CM

## 2016-03-21 DIAGNOSIS — R6889 Other general symptoms and signs: Secondary | ICD-10-CM | POA: Diagnosis not present

## 2016-03-21 DIAGNOSIS — Z1159 Encounter for screening for other viral diseases: Secondary | ICD-10-CM

## 2016-03-21 DIAGNOSIS — I341 Nonrheumatic mitral (valve) prolapse: Secondary | ICD-10-CM

## 2016-03-21 DIAGNOSIS — Z79899 Other long term (current) drug therapy: Secondary | ICD-10-CM | POA: Diagnosis not present

## 2016-03-21 DIAGNOSIS — E785 Hyperlipidemia, unspecified: Secondary | ICD-10-CM | POA: Diagnosis not present

## 2016-03-21 DIAGNOSIS — Z1389 Encounter for screening for other disorder: Secondary | ICD-10-CM

## 2016-03-21 DIAGNOSIS — F411 Generalized anxiety disorder: Secondary | ICD-10-CM | POA: Insufficient documentation

## 2016-03-21 DIAGNOSIS — E559 Vitamin D deficiency, unspecified: Secondary | ICD-10-CM

## 2016-03-21 DIAGNOSIS — Z0001 Encounter for general adult medical examination with abnormal findings: Secondary | ICD-10-CM

## 2016-03-21 DIAGNOSIS — Z136 Encounter for screening for cardiovascular disorders: Secondary | ICD-10-CM

## 2016-03-21 LAB — URINALYSIS, ROUTINE W REFLEX MICROSCOPIC
Bilirubin Urine: NEGATIVE
Glucose, UA: NEGATIVE
Hgb urine dipstick: NEGATIVE
Ketones, ur: NEGATIVE
LEUKOCYTES UA: NEGATIVE
NITRITE: NEGATIVE
PROTEIN: NEGATIVE
Specific Gravity, Urine: 1.003 (ref 1.001–1.035)
pH: 7.5 (ref 5.0–8.0)

## 2016-03-21 LAB — CBC WITH DIFFERENTIAL/PLATELET
BASOS PCT: 1 %
Basophils Absolute: 39 cells/uL (ref 0–200)
EOS ABS: 78 {cells}/uL (ref 15–500)
Eosinophils Relative: 2 %
HEMATOCRIT: 40.5 % (ref 35.0–45.0)
HEMOGLOBIN: 13.1 g/dL (ref 11.7–15.5)
Lymphocytes Relative: 30 %
Lymphs Abs: 1170 cells/uL (ref 850–3900)
MCH: 31.8 pg (ref 27.0–33.0)
MCHC: 32.3 g/dL (ref 32.0–36.0)
MCV: 98.3 fL (ref 80.0–100.0)
MPV: 9.3 fL (ref 7.5–12.5)
Monocytes Absolute: 273 cells/uL (ref 200–950)
Monocytes Relative: 7 %
NEUTROS ABS: 2340 {cells}/uL (ref 1500–7800)
NEUTROS PCT: 60 %
Platelets: 240 10*3/uL (ref 140–400)
RBC: 4.12 MIL/uL (ref 3.80–5.10)
RDW: 12.5 % (ref 11.0–15.0)
WBC: 3.9 10*3/uL (ref 3.8–10.8)

## 2016-03-21 LAB — TSH: TSH: 1.33 mIU/L

## 2016-03-21 MED ORDER — ALPRAZOLAM 1 MG PO TABS: 1.0000 mg | ORAL_TABLET | Freq: Two times a day (BID) | ORAL | 0 refills | Status: DC | PRN

## 2016-03-21 NOTE — Progress Notes (Signed)
Complete Physical  Assessment and Plan: Vitamin D deficiency - Vit D  25 hydroxy (rtn osteoporosis monitoring)  Hyperlipidemia - CBC with Differential - BASIC METABOLIC PANEL WITH GFR - Hepatic function panel - Lipid panel - TSH - Urinalysis, Routine w reflex microscopic - Microalbumin / creatinine urine ratio  Depression with anxiety Depression- continue medications, stress management techniques discussed, increase water, good sleep hygiene discussed, increase exercise, and increase veggies.    MVP (mitral valve prolapse) Monitor, no symptoms.    B12 deficiency - Vitamin B12  Encounter for general adult medical examination with abnormal findings   Medication management - Magnesium  Anxiety  With flying, will give a few xanax for flying  Discussed med's effects and SE's. Screening labs and tests as requested with regular follow-up as recommended.  HPI  51 y.o. female  presents for a complete physical.   Her blood pressure has been controlled at home, today their BP is BP: 118/70 She does workout but has not been doing as much for the holidays. She denies chest pain, shortness of breath, dizziness. No palpitations since off sudafed. She is going to [place] end of Jan to visit friends, does not like flying, will send in xanax.    She is not on cholesterol medication and denies myalgias. Her cholesterol is at goal. The cholesterol last visit was:  Lab Results  Component Value Date   CHOL 200 10/18/2015   HDL 105 10/18/2015   LDLCALC 75 10/18/2015   TRIG 99 10/18/2015   CHOLHDL 1.9 10/18/2015    Last A1C in the office was: 5.2 Patient is on Vitamin D supplement, 5000 IU daily.  Lab Results  Component Value Date   VD25OH 42 10/18/2015  B12 , is on B12 Lab Results  Component Value Date   VITAMINB12 317 03/18/2015   She is on Wellbutrin 300 XL daily, she states it is helping her for depression.  Married, has 57 year old daughter. Graduated from [employer], uncertain what  want to do.  She is going through menopause, has been having hot flashes at night that is interrupting her sleep, on estrogen patch, doing well, on bASA.  BMI is Body mass index is 24.84 kg/m., she is working on diet and exercise. Frustrated with weight, feels with menopause she is gaining more weight.  Wt Readings from Last 3 Encounters:  03/21/16 161 lb (73 kg)  10/18/15 154 lb 9.6 oz (70.1 kg)  08/18/15 152 lb (68.9 kg)    Current Medications:  Current Outpatient Prescriptions on File Prior to Visit  Medication Sig Dispense Refill  . aspirin 81 MG chewable tablet Chew 81 mg by mouth daily.    Marland Kitchen buPROPion (WELLBUTRIN XL) 300 MG 24 hr tablet TAKE 1 TABLET BY MOUTH EVERY DAY FOR MOOD 90 tablet 1  . Cholecalciferol (VITAMIN D PO) Take by mouth.    . estradiol (VIVELLE-DOT) 0.05 MG/24HR patch Place 1 patch (0.05 mg total) onto the skin 2 (two) times a week. 8 patch 12  . Fexofenadine HCl (ALLEGRA PO) Take 180 mg by mouth daily.    . progesterone (PROMETRIUM) 100 MG capsule Take 1 capsule (100 mg total) by mouth at bedtime. 30 capsule 12   No current facility-administered medications on file prior to visit.    Health Maintenance:   Immunization History  Administered Date(s) Administered  . Influenza Split 02/02/2014   Tetanus: 2010 Pneumovax: 2000 Prevnar 13: NA Flu vaccine: 2015 DUE Zostavax: N/A  LMP: None, had ablation Pap: 03/2015 Dr.  Fontaine MGM:06/2015 CAT B DEXA: N/A Colonoscopy:  07/2015 EGD: N/A Last Dental Exam: Dr. Enis Gash q 6 mons Last Eye Exam:Dr. Thom Chimes nov 2016  [name], MD as PCP - General (Internal Medicine)  Medical History:  Past Medical History:  Diagnosis Date  . Abnormal FSH level 03/2012   value 29.9  . Allergy   . Anxiety   . Depression with anxiety   . Hyperlipidemia   . MVP (mitral valve prolapse)    Antibiotics required prior to dental procedures  . 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 Vitamin D deficiency    Allergies No Known Allergies  SURGICAL HISTORY She  has a past surgical history that includes Endometrial ablation (10/2006); Augmentation mammaplasty; Cesarean section; and Dilation and curettage of uterus. FAMILY HISTORY Her family history includes Diabetes in her mother; Heart disease in her mother; Hyperlipidemia in her father and mother; Hypertension in her father and mother. SOCIAL HISTORY She  reports that she has quit smoking. She has never used smokeless tobacco. She reports that she drinks alcohol. She reports that she does not use drugs.   Review of Systems: Review of Systems  Constitutional: Negative.   HENT: Negative.   Eyes: Negative.   Respiratory: Negative.   Cardiovascular: Negative.   Gastrointestinal: Negative.   Genitourinary: Negative.   Musculoskeletal: Negative.   Skin: Negative.   Neurological: Negative.   Endo/Heme/Allergies: Negative.   Psychiatric/Behavioral: Negative.    Physical Exam: Estimated body mass index is 24.84 kg/m as calculated from the following:   Height as of this encounter: 5' 7.5" (1.715 m).   Weight as of this encounter: 161 lb (73 kg). BP 118/70   Pulse 66   Temp 97.5 F (36.4 C)   Resp 16   Ht 5' 7.5" (1.715 m)   Wt 161 lb (73 kg)   SpO2 99%   BMI 24.84 kg/m  General Appearance: Well nourished, in no apparent distress.  Eyes: PERRLA, EOMs, conjunctiva no swelling or erythema, normal fundi and vessels.  Sinuses: No Frontal/maxillary tenderness  ENT/Mouth: Ext aud canals clear, normal light reflex with TMs without erythema, bulging. Good dentition. No erythema, swelling, or exudate on post pharynx. Tonsils not swollen or erythematous. Hearing normal.  Neck: Supple, thyroid normal. No bruits  Respiratory: Respiratory effort normal, BS equal bilaterally without rales, rhonchi, wheezing or stridor.  Cardio: RRR without murmurs, rubs or gallops. Brisk peripheral pulses  without edema.  Chest: symmetric, with normal excursions and percussion.  Breasts: defer.  Abdomen: Soft, nontender, no guarding, rebound, hernias, masses, or organomegaly. .  Lymphatics: Non tender without lymphadenopathy.  Genitourinary: defer Musculoskeletal: Full ROM all peripheral extremities,5/5 strength, and normal gait.  Skin: Warm, dry without rashes, lesions, ecchymosis. Neuro: Cranial nerves intact, reflexes equal bilaterally. Normal muscle tone, no cerebellar symptoms. Sensation intact.  Psych: Awake and oriented X 3, normal affect, Insight and Judgment appropriate.   EKG: IRBBB, no ST changes   Vicie Mutters 9:57 AM [facility]

## 2016-03-21 NOTE — Patient Instructions (Signed)
80 oz of water Decrease diet soda or try la croz/sparkling waters Eat breakfast   Simple math prevails.    1st - exercise does not produce significant weight loss - at best one converts fat into muscle , "bulks up", loses inches, but usually stays "weight neutral"     2nd - think of your body weightas a check book: If you eat more calories than you burn up - you save money or gain weight .... Or if you spend more money than you put in the check book, ie burn up more calories than you eat, then you lose weight     3rd - if you walk or run 1 mile, you burn up 100 calories - you have to burn up 3,500 calories to lose 1 pound, ie you have to walk/run 35 miles to lose 1 measly pound. So if you want to lose 10 #, then you have to walk/run 350 miles, so.... clearly exercise is not the solution.     4. So if you consume 1,500 calories, then you have to burn up the equivalent of 15 miles to stay weight neutral - It also stands to reason that if you consume 1,500 cal/day and don't lose weight, then you must be burning up about 1,500 cals/day to stay weight neutral.     5. If you really want to lose weight, you must cut your calorie intake 300 calories /day and at that rate you should lose about 1 # every 3 days.   6. Please purchase Dr Fara Olden Fuhrman's book(s) "The End of Dieting" & "Eat to Live" . It has some great concepts and recipes.

## 2016-03-22 ENCOUNTER — Other Ambulatory Visit: Payer: Self-pay | Admitting: Physician Assistant

## 2016-03-22 DIAGNOSIS — E538 Deficiency of other specified B group vitamins: Secondary | ICD-10-CM | POA: Insufficient documentation

## 2016-03-22 LAB — HEPATIC FUNCTION PANEL
ALK PHOS: 70 U/L (ref 33–130)
ALT: 9 U/L (ref 6–29)
AST: 18 U/L (ref 10–35)
Albumin: 4.3 g/dL (ref 3.6–5.1)
BILIRUBIN INDIRECT: 0.4 mg/dL (ref 0.2–1.2)
Bilirubin, Direct: 0.1 mg/dL (ref <=0.2)
TOTAL PROTEIN: 6.9 g/dL (ref 6.1–8.1)
Total Bilirubin: 0.5 mg/dL (ref 0.2–1.2)

## 2016-03-22 LAB — MICROALBUMIN / CREATININE URINE RATIO
CREATININE, URINE: 25 mg/dL (ref 20–320)
Microalb, Ur: <0.2 mg/dL

## 2016-03-22 LAB — MAGNESIUM: Magnesium: 2 mg/dL (ref 1.5–2.5)

## 2016-03-22 LAB — IRON AND TIBC
%SAT: 39 % (ref 11–50)
Iron: 132 ug/dL (ref 45–160)
TIBC: 336 ug/dL (ref 250–450)
UIBC: 204 ug/dL (ref 125–400)

## 2016-03-22 LAB — BASIC METABOLIC PANEL WITH GFR
BUN: 12 mg/dL (ref 7–25)
CHLORIDE: 105 mmol/L (ref 98–110)
CO2: 24 mmol/L (ref 20–31)
Calcium: 9.4 mg/dL (ref 8.6–10.4)
Creat: 0.7 mg/dL (ref 0.50–1.05)
GFR, Est African American: >89 mL/min (ref >=60)
GLUCOSE: 82 mg/dL (ref 65–99)
POTASSIUM: 4.6 mmol/L (ref 3.5–5.3)
Sodium: 140 mmol/L (ref 135–146)

## 2016-03-22 LAB — VITAMIN D 25 HYDROXY (VIT D DEFICIENCY, FRACTURES): VIT D 25 HYDROXY: 39 ng/mL (ref 30–100)

## 2016-03-22 LAB — LIPID PANEL
Cholesterol: 245 mg/dL — ABNORMAL HIGH (ref <200)
HDL: 116 mg/dL (ref >50)
LDL CALC: 111 mg/dL — AB (ref <100)
TRIGLYCERIDES: 89 mg/dL (ref <150)
Total CHOL/HDL Ratio: 2.1 Ratio (ref <5.0)
VLDL: 18 mg/dL (ref <30)

## 2016-03-22 LAB — HIV ANTIBODY (ROUTINE TESTING W REFLEX): HIV 1&2 Ab, 4th Generation: NONREACTIVE

## 2016-03-22 LAB — VITAMIN B12: Vitamin B-12: 198 pg/mL — ABNORMAL LOW (ref 200–1100)

## 2016-06-11 ENCOUNTER — Other Ambulatory Visit: Payer: Self-pay | Admitting: Physician Assistant

## 2016-06-11 DIAGNOSIS — F418 Other specified anxiety disorders: Secondary | ICD-10-CM

## 2016-06-20 ENCOUNTER — Other Ambulatory Visit: Payer: Self-pay | Admitting: Gynecology

## 2016-06-20 DIAGNOSIS — Z1231 Encounter for screening mammogram for malignant neoplasm of breast: Secondary | ICD-10-CM

## 2016-06-21 ENCOUNTER — Other Ambulatory Visit: Payer: Self-pay | Admitting: Physician Assistant

## 2016-06-21 ENCOUNTER — Encounter: Payer: Self-pay | Admitting: Physician Assistant

## 2016-06-21 MED ORDER — ALPRAZOLAM 1 MG PO TABS: 1.0000 mg | ORAL_TABLET | Freq: Two times a day (BID) | ORAL | 1 refills | Status: DC | PRN

## 2016-06-22 NOTE — Telephone Encounter (Signed)
Xanax was called into pharmacy on 5th April 2018 @ 11:24am by DD

## 2016-06-29 ENCOUNTER — Other Ambulatory Visit: Payer: Self-pay

## 2016-06-29 MED ORDER — PROGESTERONE MICRONIZED 100 MG PO CAPS: 100.0000 mg | ORAL_CAPSULE | Freq: Every day | ORAL | 1 refills | Status: DC

## 2016-06-30 ENCOUNTER — Other Ambulatory Visit: Payer: Self-pay

## 2016-06-30 MED ORDER — PROGESTERONE MICRONIZED 100 MG PO CAPS: 100.0000 mg | ORAL_CAPSULE | Freq: Every day | ORAL | 0 refills | Status: DC

## 2016-06-30 NOTE — Telephone Encounter (Signed)
Pharmacy requests 90 day supply

## 2016-07-18 ENCOUNTER — Ambulatory Visit
Admission: RE | Admit: 2016-07-18 | Discharge: 2016-07-18 | Disposition: A | Discharge status: Home / Self Care | Payer: BLUE CROSS/BLUE SHIELD | Source: Ambulatory Visit | Attending: Gynecology | Admitting: Gynecology

## 2016-07-18 DIAGNOSIS — Z1231 Encounter for screening mammogram for malignant neoplasm of breast: Secondary | ICD-10-CM

## 2016-07-20 ENCOUNTER — Other Ambulatory Visit: Payer: Self-pay | Admitting: Gynecology

## 2016-07-27 ENCOUNTER — Other Ambulatory Visit: Payer: Self-pay | Admitting: Gynecology

## 2016-08-03 ENCOUNTER — Other Ambulatory Visit: Payer: Self-pay | Admitting: Gynecology

## 2016-08-18 ENCOUNTER — Ambulatory Visit (INDEPENDENT_AMBULATORY_CARE_PROVIDER_SITE_OTHER): Payer: Commercial Managed Care - PPO | Admitting: Gynecology

## 2016-08-18 ENCOUNTER — Encounter: Payer: Self-pay | Admitting: Gynecology

## 2016-08-18 VITALS — BP 120/72 | Ht 67.0 in | Wt 158.0 lb

## 2016-08-18 DIAGNOSIS — Z01419 Encounter for gynecological examination (general) (routine) without abnormal findings: Secondary | ICD-10-CM | POA: Diagnosis not present

## 2016-08-18 DIAGNOSIS — Z1151 Encounter for screening for human papillomavirus (HPV): Secondary | ICD-10-CM | POA: Diagnosis not present

## 2016-08-18 DIAGNOSIS — Z79890 Hormone replacement therapy (postmenopausal): Secondary | ICD-10-CM

## 2016-08-18 MED ORDER — ESTRADIOL 0.05 MG/24HR TD PTTW: 1.0000 | MEDICATED_PATCH | TRANSDERMAL | 12 refills | Status: DC

## 2016-08-18 MED ORDER — PROGESTERONE MICRONIZED 100 MG PO CAPS: 100.0000 mg | ORAL_CAPSULE | Freq: Every day | ORAL | 4 refills | Status: DC

## 2016-08-18 NOTE — Addendum Note (Signed)
Addended by: Nelva Nay on: 08/18/2016 11:08 AM   Modules accepted: Orders

## 2016-08-18 NOTE — Progress Notes (Signed)
    Mary [name] 1965-06-13 [id number]        51 y.o.  G1P1001 for annual exam.    Past medical history,surgical history, problem list, medications, allergies, family history and social history were all reviewed and documented as reviewed in the EPIC chart.  ROS:  Performed with pertinent positives and negatives included in the history, assessment and plan.   Additional significant findings :  None   Exam: Caryn Bee assistant Vitals:   08/18/16 1026  BP: 120/72  Weight: 158 lb (71.7 kg)  Height: 5\' 7"  (1.702 m)   Body mass index is 24.75 kg/m.  General appearance:  Normal affect, orientation and appearance. Skin: Grossly normal HEENT: Without gross lesions.  No cervical or supraclavicular adenopathy. Thyroid normal.  Lungs:  Clear without wheezing, rales or rhonchi Cardiac: RR, without RMG Abdominal:  Soft, nontender, without masses, guarding, rebound, organomegaly or hernia Breasts:  Examined lying and sitting without masses, retractions, discharge or axillary adenopathy.  Bilateral implants noted Pelvic:  Ext, BUS, Vagina: Normal with mild atrophic changes  Cervix: Normal. Pap smear/HPV  Uterus: Anteverted, normal size, shape and contour, midline and mobile nontender   Adnexa: Without masses or tenderness    Anus and perineum: Normal   Rectovaginal: Normal sphincter tone without palpated masses or tenderness.    Assessment/Plan:  51 y.o. G24P1001 female for annual exam.   1. Postmenopausal/HRT. Continues on Vivelle 0.05 mg patches and Prometrium 100 mg at bedtime. Doing well with good symptom relief and no bleeding. I have reviewed the issues with HRT to include risks of thrombosis such as stroke heart attack DVT possible breast cancer. Benefits as far as symptom relief and possible cardiovascular and bone health. Patient wants to continue and I refilled her 1 year. 2. Mammography 07/2016. Continue with annual mammography when due. SBE monthly reviewed. 3. Pap smear/HPV  03/2012. Pap smear/HPV today. No history of significant abnormal Pap smears previously. 4. Colonoscopy 2017. Repeat at their recommended interval. 5. Health maintenance. No routine lab work done as patient reports this done elsewhere. Follow up 1 year, sooner as needed.   Anastasio Auerbach MD, 10:58 AM 08/18/2016

## 2016-08-18 NOTE — Patient Instructions (Signed)
Followup in one year for annual exam, sooner if any issues 

## 2016-08-22 LAB — PAP IG AND HPV HIGH-RISK: HPV DNA HIGH RISK: NOT DETECTED

## 2016-11-01 ENCOUNTER — Other Ambulatory Visit: Payer: Self-pay | Admitting: Gynecology

## 2017-01-18 DIAGNOSIS — Z23 Encounter for immunization: Secondary | ICD-10-CM | POA: Diagnosis not present

## 2017-03-20 NOTE — Progress Notes (Signed)
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    MVP (mitral valve prolapse) Monitor, no symptoms.    B12 deficiency - Vitamin B12 - NEED TO GET ON IT  Encounter for general adult medical examination with abnormal findings 1 year   Medication management - Magnesium  Anxiety  With flying, and likes to have AS needed will send in xanax  Neck pain Exercises given If worse will send to ortho   Discussed med's effects and SE's. Screening labs and tests as requested with regular follow-up as recommended.  HPI  52 y.o. female  presents for a complete physical.   Her blood pressure has been controlled at home, today their BP is BP: 118/74 She does not workout, she has stopped going tot he gym. She denies chest pain, shortness of breath, dizziness.    She is not on cholesterol medication and denies myalgias. Her cholesterol is at goal. The cholesterol last visit was:  Lab Results  Component Value Date   CHOL 245 (H) 03/21/2016   HDL 116 03/21/2016   LDLCALC 111 (H) 03/21/2016   TRIG 89 03/21/2016   CHOLHDL 2.1 03/21/2016    Last A1C in the office was: 5.2 Patient is on Vitamin D supplement, 5000 IU daily.  Lab Results  Component Value Date   VD25OH 39 03/21/2016  B12 , is on B12 Lab Results  Component Value Date   VITAMINB12 198 (L) 03/21/2016   She is on Wellbutrin 300 XL daily, she states it is helping her for depression.  Married, has 58 year old daughter. Graduated from [employer], uncertain what want to do.   She is going through menopause, on progesterone and estrogen patch, follows with Dr. Phineas Real.  BMI is Body mass index is 25.42 kg/m., she is working on diet and exercise. Frustrated with weight, feels with menopause she is gaining more weight.   Wt Readings from Last 3 Encounters:  03/22/17 162 lb 4.8 oz (73.6 kg)  08/18/16 158 lb (71.7 kg)  03/21/16 161 lb (73 kg)   Current Medications:  Current Outpatient Medications on File Prior to Visit  Medication Sig Dispense Refill  . ALPRAZolam (XANAX) 1 MG tablet Take 1 tablet (1 mg total) by mouth 2 (two) times daily as needed for sleep. 30 tablet 1  . aspirin 81 MG chewable tablet Chew 81 mg by mouth daily.    Marland Kitchen buPROPion (WELLBUTRIN XL) 300 MG 24 hr tablet TAKE 1 TABLET BY MOUTH EVERY DAY FOR MOOD 90 tablet 3  . Cholecalciferol (VITAMIN D PO) Take by mouth.    . estradiol (VIVELLE-DOT) 0.05 MG/24HR patch Place 1 patch (0.05 mg total) onto the skin 2 (two) times a week. 8 patch 12  . Fexofenadine HCl (ALLEGRA PO) Take 180 mg by mouth daily.    . progesterone (PROMETRIUM) 100 MG capsule Take 1 capsule (100 mg total) by mouth at bedtime. 90 capsule 4   No current facility-administered medications on file prior to visit.    Health Maintenance:   Immunization History  Administered Date(s) Administered  . Influenza Split 02/02/2014   Tetanus: 2010 Pneumovax: 2000  Prevnar 13: NA Flu vaccine: 2018 at [facility] Zostavax: N/A  LMP: None, had ablation Pap: 08/2016 Dr. Phineas Real MGM:07/2016 CAT B DEXA: N/A Colonoscopy:  07/2015 EGD: N/A Last Dental Exam: Dr. Enis Gash q 6 mons Last Eye Exam:Dr. Thom Chimes 2018  [name], MD as PCP - General (Internal Medicine)  Medical History:  Past Medical History:  Diagnosis Date  . Abnormal FSH level 03/2012   value 29.9  . Allergy   . Anxiety   . Depression with anxiety   . Hyperlipidemia   . MVP (mitral valve prolapse)    Antibiotics required prior to dental procedures  .  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 Vitamin D deficiency    Allergies No Known Allergies  SURGICAL HISTORY She  has a past surgical history that includes Endometrial ablation (10/2006); Cesarean section; Dilation and curettage of uterus; and Augmentation mammaplasty (Bilateral, 1989). FAMILY HISTORY Her family history includes Diabetes in her mother; Heart disease in her mother; Hyperlipidemia in her father and mother; Hypertension in her father and mother. SOCIAL HISTORY She  reports that she has quit smoking. she has never used smokeless tobacco. She reports that she drinks alcohol. She reports that she does not use drugs.   Review of Systems: Review of Systems  Constitutional: Negative.   HENT: Negative.   Eyes: Negative.   Respiratory: Negative.   Cardiovascular: Negative.   Gastrointestinal: Negative.   Genitourinary: Negative.   Musculoskeletal: Negative.   Skin: Negative.   Neurological: Negative.   Endo/Heme/Allergies: Negative.   Psychiatric/Behavioral: Negative.    Physical Exam: Estimated body mass index is 25.42 kg/m as calculated from the following:   Height as of this encounter: 5\' 7"  (1.702 m).   Weight as of this encounter: 162 lb 4.8 oz (73.6 kg). BP 118/74   Temp (!) 97.5 F (36.4 C)   Resp 16   Ht 5\' 7"  (1.702 m)   Wt 162 lb 4.8 oz (73.6 kg)   BMI 25.42 kg/m  General Appearance: Well nourished, in no apparent distress.  Eyes: PERRLA, EOMs, conjunctiva no swelling or erythema, normal fundi and vessels.  Sinuses: No Frontal/maxillary tenderness  ENT/Mouth: Ext aud canals clear, normal light reflex with TMs without erythema, bulging. Good dentition. No erythema, swelling, or exudate on post pharynx. Tonsils not swollen or erythematous. Hearing normal.  Neck: Supple, thyroid normal. No bruits  Respiratory: Respiratory effort normal, BS equal bilaterally without rales, rhonchi, wheezing or stridor.   Cardio: RRR without murmurs, rubs or gallops. Brisk peripheral pulses without edema.  Chest: symmetric, with normal excursions and percussion.  Breasts: defer.  Abdomen: Soft, nontender, no guarding, rebound, hernias, masses, or organomegaly. .  Lymphatics: Non tender without lymphadenopathy.  Genitourinary: defer Musculoskeletal: Full ROM all peripheral extremities,5/5 strength, and normal gait.  Skin: Warm, dry without rashes, lesions, ecchymosis. Neuro: Cranial nerves intact, reflexes equal bilaterally. Normal muscle tone, no cerebellar symptoms. Sensation intact.  Psych: Awake and oriented X 3, normal affect, Insight and Judgment appropriate.   EKG: IRBBB, no ST changes   Vicie Mutters 10:13 AM [facility]

## 2017-03-22 ENCOUNTER — Encounter: Payer: Self-pay | Admitting: Physician Assistant

## 2017-03-22 ENCOUNTER — Ambulatory Visit: Payer: Commercial Managed Care - PPO | Admitting: Physician Assistant

## 2017-03-22 VITALS — BP 118/74 | Temp 97.5°F | Resp 16 | Ht 67.0 in | Wt 162.3 lb

## 2017-03-22 DIAGNOSIS — Z79899 Other long term (current) drug therapy: Secondary | ICD-10-CM

## 2017-03-22 DIAGNOSIS — Z6824 Body mass index (BMI) 24.0-24.9, adult: Secondary | ICD-10-CM

## 2017-03-22 DIAGNOSIS — F325 Major depressive disorder, single episode, in full remission: Secondary | ICD-10-CM

## 2017-03-22 DIAGNOSIS — Z1389 Encounter for screening for other disorder: Secondary | ICD-10-CM

## 2017-03-22 DIAGNOSIS — I341 Nonrheumatic mitral (valve) prolapse: Secondary | ICD-10-CM

## 2017-03-22 DIAGNOSIS — F418 Other specified anxiety disorders: Secondary | ICD-10-CM

## 2017-03-22 DIAGNOSIS — E538 Deficiency of other specified B group vitamins: Secondary | ICD-10-CM

## 2017-03-22 DIAGNOSIS — Z0000 Encounter for general adult medical examination without abnormal findings: Secondary | ICD-10-CM | POA: Diagnosis not present

## 2017-03-22 DIAGNOSIS — E785 Hyperlipidemia, unspecified: Secondary | ICD-10-CM

## 2017-03-22 DIAGNOSIS — E559 Vitamin D deficiency, unspecified: Secondary | ICD-10-CM

## 2017-03-22 DIAGNOSIS — D509 Iron deficiency anemia, unspecified: Secondary | ICD-10-CM

## 2017-03-22 DIAGNOSIS — Z0001 Encounter for general adult medical examination with abnormal findings: Secondary | ICD-10-CM

## 2017-03-22 DIAGNOSIS — F411 Generalized anxiety disorder: Secondary | ICD-10-CM

## 2017-03-22 MED ORDER — ALPRAZOLAM 1 MG PO TABS: 1.0000 mg | ORAL_TABLET | Freq: Two times a day (BID) | ORAL | 0 refills | Status: DC | PRN

## 2017-03-22 MED ORDER — BUPROPION HCL ER (XL) 300 MG PO TB24: ORAL_TABLET | ORAL | 3 refills | Status: DC

## 2017-03-22 NOTE — Patient Instructions (Addendum)
Try hot tea at night Break the trigger  Being dehydrated can hurt your kidneys, cause fatigue, headaches, muscle aches, joint pain, and dry skin/nails so please increase your fluids.   Drink 80-100 oz a day of water, measure it out! Eat 3 meals a day, have to do breakfast, eat protein- hard boiled eggs, protein bar like nature [place] protein bar, greek yogurt like oikos triple zero, chobani 100, or light n fit greek  Can check out plantnanny app on your phone to help you keep track of your water     Go to the ER if you have any new weakness in your arms, trouble with your grip, worse headache ever, fever, chills. or have worsening pain.    Cervical Sprain A cervical sprain is a stretch or tear in one or more of the tough, cord-like tissues that connect bones (ligaments) in the neck. Cervical sprains can range from mild to severe. Severe cervical sprains can cause the spinal bones (vertebrae) in the neck to be unstable. This can lead to spinal cord damage and can result in serious nervous system problems. The amount of time that it takes for a cervical sprain to get better depends on the cause and extent of the injury. Most cervical sprains heal in 4-6 weeks. What are the causes? Cervical sprains may be caused by an injury (trauma), such as from a motor vehicle accident, a fall, or sudden forward and backward whipping movement of the head and neck (whiplash injury). Mild cervical sprains may be caused by wear and tear over time, such as from poor posture, sitting in a chair that does not provide support, or looking up or down for long periods of time. What increases the risk? The following factors may make you more likely to develop this condition:  Participating in activities that have a high risk of trauma to the neck. These include contact sports, auto racing, gymnastics, and diving.  Taking risks when driving or riding in a motor vehicle, such as speeding.  Having  osteoarthritis of the spine.  Having poor strength and flexibility of the neck.  A previous neck injury.  Having poor posture.  Spending a lot of time in certain positions that put stress on the neck, such as sitting at a computer for long periods of time.  What are the signs or symptoms? Symptoms of this condition include:  Pain, soreness, stiffness, tenderness, swelling, or a burning sensation in the front, back, or sides of the neck.  Sudden tightening of neck muscles that you cannot control (muscle spasms).  Pain in the shoulders or upper back.  Limited ability to move the neck.  Headache.  Dizziness.  Nausea.  Vomiting.  Weakness, numbness, or tingling in a hand or an arm.  Symptoms may develop right away after injury, or they may develop over a few days. In some cases, symptoms may go away with treatment and return (recur) over time. How is this diagnosed? This condition may be diagnosed based on:  Your medical history.  Your symptoms.  Any recent injuries or known neck problems that you have, such as arthritis in the neck.  A physical exam.  Imaging tests, such as: ? X-rays. ? MRI. ? CT scan.  How is this treated? This condition is treated by resting and icing the injured area and doing physical therapy exercises. Depending on the severity of your condition, treatment may also include:  Keeping your neck in place (immobilized) for periods of  time. This may be done using: ? A cervical collar. This supports your chin and the back of your head. ? A cervical traction device. This is a sling that holds up your head. This removes weight and pressure from your neck, and it may help to relieve pain.  Medicines that help to relieve pain and inflammation.  Medicines that help to relax your muscles (muscle relaxants).  Surgery. This is rare.  Follow these instructions at home: If you have a cervical collar:  Wear it as told by your health care provider. Do  not remove the collar unless instructed by your health care provider.  Ask your health care provider before you make any adjustments to your collar.  If you have long hair, keep it outside of the collar.  Ask your health care provider if you can remove the collar for cleaning and bathing. If you are allowed to remove the collar for cleaning or bathing: ? Follow instructions from your health care provider about how to remove the collar safely. ? Clean the collar by wiping it with mild soap and water and drying it completely. ? If your collar has removable pads, remove them every 1-2 days and wash them by hand with soap and water. Let them air-dry completely before you put them back in the collar. ? Check your skin under the collar for irritation or sores. If you see any, tell your health care provider. Managing pain, stiffness, and swelling  If directed, use a cervical traction device as told by your health care provider.  If directed, apply heat to the affected area before you do your physical therapy or as often as told by your health care provider. Use the heat source that your health care provider recommends, such as a moist heat pack or a heating pad. ? Place a towel between your skin and the heat source. ? Leave the heat on for 20-30 minutes. ? Remove the heat if your skin turns bright red. This is especially important if you are unable to feel pain, heat, or cold. You may have a greater risk of getting burned.  If directed, put ice on the affected area: ? Put ice in a plastic bag. ? Place a towel between your skin and the bag. ? Leave the ice on for 20 minutes, 2-3 times a day. Activity  Do not drive while wearing a cervical collar. If you do not have a cervical collar, ask your health care provider if it is safe to drive while your neck heals.  Do not drive or use heavy machinery while taking prescription pain medicine or muscle relaxants, unless your health care provider  approves.  Do not lift anything that is heavier than 10 lb (4.5 kg) until your health care provider tells you that it is safe.  Rest as directed by your health care provider. Avoid positions and activities that make your symptoms worse. Ask your health care provider what activities are safe for you.  If physical therapy was prescribed, do exercises as told by your health care provider or physical therapist. General instructions  Take over-the-counter and prescription medicines only as told by your health care provider.  Do not use any products that contain nicotine or tobacco, such as cigarettes and e-cigarettes. These can delay healing. If you need help quitting, ask your health care provider.  Keep all follow-up visits as told by your health care provider or physical therapist. This is important. How is this prevented? To prevent a cervical  sprain from happening again:  Use and maintain good posture. Make any needed adjustments to your workstation to help you use good posture.  Exercise regularly as directed by your health care provider or physical therapist.  Avoid risky activities that may cause a cervical sprain.  Contact a health care provider if:  You have symptoms that get worse or do not get better after 2 weeks of treatment.  You have pain that gets worse or does not get better with medicine.  You develop new, unexplained symptoms.  You have sores or irritated skin on your neck from wearing your cervical collar. Get help right away if:  You have severe pain.  You develop numbness, tingling, or weakness in any part of your body.  You cannot move a part of your body (you have paralysis).  You have neck pain along with: ? Severe dizziness. ? Headache. Summary  A cervical sprain is a stretch or tear in one or more of the tough, cord-like tissues that connect bones (ligaments) in the neck.  Cervical sprains may be caused by an injury (trauma), such as from a motor  vehicle accident, a fall, or sudden forward and backward whipping movement of the head and neck (whiplash injury).  Symptoms may develop right away after injury, or they may develop over a few days.  This condition is treated by resting and icing the injured area and doing physical therapy exercises. This information is not intended to replace advice given to you by your health care provider. Make sure you discuss any questions you have with your health care provider. Document Released: 01/01/2007 Document Revised: 11/03/2015 Document Reviewed: 11/03/2015 Elsevier Interactive Patient Education  2017 [employer]   Cervical Strain and Sprain Rehab Ask your health care provider which exercises are safe for you. Do exercises exactly as told by your health care provider and adjust them as directed. It is normal to feel mild stretching, pulling, tightness, or discomfort as you do these exercises, but you should stop right away if you feel sudden pain or your pain gets worse.Do not begin these exercises until told by your health care provider. Stretching and range of motion exercises These exercises warm up your muscles and joints and improve the movement and flexibility of your neck. These exercises also help to relieve pain, numbness, and tingling. Exercise A: Cervical side bend  1. Using good posture, sit on a stable chair or stand up. 2. Without moving your shoulders, slowly tilt your left / right ear to your shoulder until you feel a stretch in your neck muscles. You should be looking straight ahead. 3. Hold for __________ seconds. 4. Repeat with the other side of your neck. Repeat __________ times. Complete this exercise __________ times a day. Exercise B: Cervical rotation  1. Using good posture, sit on a stable chair or stand up. 2. Slowly turn your head to the side as if you are looking over your left / right shoulder. ? Keep your eyes level with the ground. ? Stop when you feel a  stretch along the side and the back of your neck. 3. Hold for __________ seconds. 4. Repeat this by turning to your other side. Repeat __________ times. Complete this exercise __________ times a day. Exercise C: Thoracic extension and pectoral stretch 1. Roll a towel or a small blanket so it is about 4 inches (10 cm) in diameter. 2. Lie down on your back on a firm surface. 3. Put the towel lengthwise, under your spine  in the middle of your back. It should not be not under your shoulder blades. The towel should line up with your spine from your middle back to your lower back. 4. Put your hands behind your head and let your elbows fall out to your sides. 5. Hold for __________ seconds. Repeat __________ times. Complete this exercise __________ times a day. Strengthening exercises These exercises build strength and endurance in your neck. Endurance is the ability to use your muscles for a long time, even after your muscles get tired. Exercise D: Upper cervical flexion, isometric 1. Lie on your back with a thin pillow behind your head and a small rolled-up towel under your neck. 2. Gently tuck your chin toward your chest and nod your head down to look toward your feet. Do not lift your head off the pillow. 3. Hold for __________ seconds. 4. Release the tension slowly. Relax your neck muscles completely before you repeat this exercise. Repeat __________ times. Complete this exercise __________ times a day. Exercise E: Cervical extension, isometric  1. Stand about 6 inches (15 cm) away from a wall, with your back facing the wall. 2. Place a soft object, about 6-8 inches (15-20 cm) in diameter, between the back of your head and the wall. A soft object could be a small pillow, a ball, or a folded towel. 3. Gently tilt your head back and press into the soft object. Keep your jaw and forehead relaxed. 4. Hold for __________ seconds. 5. Release the tension slowly. Relax your neck muscles completely  before you repeat this exercise. Repeat __________ times. Complete this exercise __________ times a day. Posture and body mechanics  Body mechanics refers to the movements and positions of your body while you do your daily activities. Posture is part of body mechanics. Good posture and healthy body mechanics can help to relieve stress in your body's tissues and joints. Good posture means that your spine is in its natural S-curve position (your spine is neutral), your shoulders are pulled back slightly, and your head is not tipped forward. The following are general guidelines for applying improved posture and body mechanics to your everyday activities. Standing  When standing, keep your spine neutral and keep your feet about hip-width apart. Keep a slight bend in your knees. Your ears, shoulders, and hips should line up.  When you do a task in which you stand in one place for a long time, place one foot up on a stable object that is 2-4 inches (5-10 cm) high, such as a footstool. This helps keep your spine neutral. Sitting   When sitting, keep your spine neutral and your keep feet flat on the floor. Use a footrest, if necessary, and keep your thighs parallel to the floor. Avoid rounding your shoulders, and avoid tilting your head forward.  When working at a desk or a computer, keep your desk at a height where your hands are slightly lower than your elbows. Slide your chair under your desk so you are close enough to maintain good posture.  When working at a computer, place your monitor at a height where you are looking straight ahead and you do not have to tilt your head forward or downward to look at the screen. Resting When lying down and resting, avoid positions that are most painful for you. Try to support your neck in a neutral position. You can use a contour pillow or a small rolled-up towel. Your pillow should support your neck but not push on  it. This information is not intended to replace  advice given to you by your health care provider. Make sure you discuss any questions you have with your health care provider. Document Released: 03/06/2005 Document Revised: 11/11/2015 Document Reviewed: 02/10/2015 Elsevier Interactive Patient Education  [employer].

## 2017-03-23 LAB — HEPATIC FUNCTION PANEL
AG RATIO: 1.6 (calc) (ref 1.0–2.5)
ALBUMIN MSPROF: 4.3 g/dL (ref 3.6–5.1)
ALT: 10 U/L (ref 6–29)
AST: 15 U/L (ref 10–35)
Alkaline phosphatase (APISO): 74 U/L (ref 33–130)
BILIRUBIN TOTAL: 0.6 mg/dL (ref 0.2–1.2)
Bilirubin, Direct: 0.1 mg/dL (ref 0.0–0.2)
GLOBULIN: 2.7 g/dL (ref 1.9–3.7)
Indirect Bilirubin: 0.5 mg/dL (calc) (ref 0.2–1.2)
TOTAL PROTEIN: 7 g/dL (ref 6.1–8.1)

## 2017-03-23 LAB — MAGNESIUM: MAGNESIUM: 2.1 mg/dL (ref 1.5–2.5)

## 2017-03-23 LAB — URINALYSIS, ROUTINE W REFLEX MICROSCOPIC
BILIRUBIN URINE: NEGATIVE
GLUCOSE, UA: NEGATIVE
HGB URINE DIPSTICK: NEGATIVE
KETONES UR: NEGATIVE
Leukocytes, UA: NEGATIVE
Nitrite: NEGATIVE
PH: 7 (ref 5.0–8.0)
Protein, ur: NEGATIVE
SPECIFIC GRAVITY, URINE: 1.005 (ref 1.001–1.03)

## 2017-03-23 LAB — BASIC METABOLIC PANEL WITH GFR
BUN: 14 mg/dL (ref 7–25)
CO2: 29 mmol/L (ref 20–32)
CREATININE: 0.88 mg/dL (ref 0.50–1.05)
Calcium: 9.6 mg/dL (ref 8.6–10.4)
Chloride: 106 mmol/L (ref 98–110)
GFR, Est African American: 88 mL/min/{1.73_m2} (ref >=60)
GFR, Est Non African American: 76 mL/min/{1.73_m2} (ref >=60)
GLUCOSE: 83 mg/dL (ref 65–99)
Potassium: 5.3 mmol/L (ref 3.5–5.3)
Sodium: 144 mmol/L (ref 135–146)

## 2017-03-23 LAB — CBC WITH DIFFERENTIAL/PLATELET
BASOS ABS: 29 {cells}/uL (ref 0–200)
Basophils Relative: 0.7 %
EOS ABS: 109 {cells}/uL (ref 15–500)
EOS PCT: 2.6 %
HCT: 39.5 % (ref 35.0–45.0)
HEMOGLOBIN: 13.1 g/dL (ref 11.7–15.5)
Lymphs Abs: 1344 cells/uL (ref 850–3900)
MCH: 31.3 pg (ref 27.0–33.0)
MCHC: 33.2 g/dL (ref 32.0–36.0)
MCV: 94.3 fL (ref 80.0–100.0)
MONOS PCT: 7.2 %
MPV: 9.8 fL (ref 7.5–12.5)
NEUTROS ABS: 2415 {cells}/uL (ref 1500–7800)
Neutrophils Relative %: 57.5 %
Platelets: 247 10*3/uL (ref 140–400)
RBC: 4.19 10*6/uL (ref 3.80–5.10)
RDW: 11.3 % (ref 11.0–15.0)
Total Lymphocyte: 32 %
WBC mixed population: 302 cells/uL (ref 200–950)
WBC: 4.2 10*3/uL (ref 3.8–10.8)

## 2017-03-23 LAB — TSH: TSH: 2.3 m[IU]/L

## 2017-03-23 LAB — VITAMIN D 25 HYDROXY (VIT D DEFICIENCY, FRACTURES): Vit D, 25-Hydroxy: 48 ng/mL (ref 30–100)

## 2017-03-23 LAB — MICROALBUMIN / CREATININE URINE RATIO
Creatinine, Urine: 37 mg/dL (ref 20–275)
Microalb Creat Ratio: 11 mcg/mg creat (ref <30)
Microalb, Ur: 0.4 mg/dL

## 2017-03-23 LAB — IRON, TOTAL/TOTAL IRON BINDING CAP
%SAT: 35 % (ref 11–50)
Iron: 108 ug/dL (ref 45–160)
TIBC: 310 mcg/dL (calc) (ref 250–450)

## 2017-03-23 LAB — LIPID PANEL
CHOL/HDL RATIO: 2.5 (calc) (ref <5.0)
Cholesterol: 263 mg/dL — ABNORMAL HIGH (ref <200)
HDL: 107 mg/dL (ref >50)
LDL CHOLESTEROL (CALC): 132 mg/dL — AB
NON-HDL CHOLESTEROL (CALC): 156 mg/dL — AB (ref <130)
TRIGLYCERIDES: 125 mg/dL (ref <150)

## 2017-03-23 LAB — VITAMIN B12: Vitamin B-12: 294 pg/mL (ref 200–1100)

## 2017-06-27 ENCOUNTER — Other Ambulatory Visit: Payer: Self-pay | Admitting: Gynecology

## 2017-06-27 DIAGNOSIS — Z1231 Encounter for screening mammogram for malignant neoplasm of breast: Secondary | ICD-10-CM

## 2017-07-20 ENCOUNTER — Ambulatory Visit
Admission: RE | Admit: 2017-07-20 | Discharge: 2017-07-20 | Disposition: A | Discharge status: Home / Self Care | Payer: Commercial Managed Care - PPO | Source: Ambulatory Visit | Attending: Gynecology | Admitting: Gynecology

## 2017-07-20 DIAGNOSIS — Z1231 Encounter for screening mammogram for malignant neoplasm of breast: Secondary | ICD-10-CM

## 2017-08-17 ENCOUNTER — Other Ambulatory Visit: Payer: Self-pay | Admitting: Gynecology

## 2017-08-21 ENCOUNTER — Other Ambulatory Visit: Payer: Self-pay | Admitting: Gynecology

## 2017-09-04 ENCOUNTER — Ambulatory Visit: Payer: Commercial Managed Care - PPO | Admitting: Gynecology

## 2017-09-04 ENCOUNTER — Encounter: Payer: Self-pay | Admitting: Gynecology

## 2017-09-04 VITALS — BP 118/76 | Ht 67.0 in | Wt 161.0 lb

## 2017-09-04 DIAGNOSIS — Z01419 Encounter for gynecological examination (general) (routine) without abnormal findings: Secondary | ICD-10-CM

## 2017-09-04 DIAGNOSIS — Z79890 Hormone replacement therapy (postmenopausal): Secondary | ICD-10-CM | POA: Diagnosis not present

## 2017-09-04 MED ORDER — ESTRADIOL 0.075 MG/24HR TD PTTW: 1.0000 | MEDICATED_PATCH | TRANSDERMAL | 4 refills | Status: DC

## 2017-09-04 MED ORDER — PROGESTERONE MICRONIZED 100 MG PO CAPS: 100.0000 mg | ORAL_CAPSULE | Freq: Every day | ORAL | 4 refills | Status: DC

## 2017-09-04 NOTE — Patient Instructions (Signed)
Try the higher dose estrogen patch.  Call if you have any issues with this.  Follow-up in 1 year for annual exam.

## 2017-09-04 NOTE — Progress Notes (Signed)
    Mary [name] 1965/06/04 [id number]        52 y.o.  G1P1001 for annual gynecologic exam.  Is on estradiol patch 0.05 mg and Prometrium 100 mg nightly.  Does note over the past year having more hot flushes and sweats.  Otherwise doing well with no bleeding.  Past medical history,surgical history, problem list, medications, allergies, family history and social history were all reviewed and documented as reviewed in the EPIC chart.  ROS:  Performed with pertinent positives and negatives included in the history, assessment and plan.   Additional significant findings : None   Exam: Caryn Bee assistant Vitals:   09/04/17 1028  BP: 118/76  Weight: 161 lb (73 kg)  Height: 5\' 7"  (1.702 m)   Body mass index is 25.22 kg/m.  General appearance:  Normal affect, orientation and appearance. Skin: Grossly normal HEENT: Without gross lesions.  No cervical or supraclavicular adenopathy. Thyroid normal.  Lungs:  Clear without wheezing, rales or rhonchi Cardiac: RR, without RMG Abdominal:  Soft, nontender, without masses, guarding, rebound, organomegaly or hernia Breasts:  Examined lying and sitting without masses, retractions, discharge or axillary adenopathy.  Bilateral implants noted. Pelvic:  Ext, BUS, Vagina: Normal with mild atrophic changes  Cervix: Normal  Uterus: Anteverted, normal size, shape and contour, midline and mobile nontender   Adnexa: Without masses or tenderness    Anus and perineum: Normal   Rectovaginal: Normal sphincter tone without palpated masses or tenderness.    Assessment/Plan:  52 y.o. G105P1001 female for annual gynecologic exam.   1. Postmenopausal/HRT.  Having slight increase in hot flushes and sweats.  Currently on 0.05 mg patch.  Recommended she increase to 0.075 and see how she does with this.  Continue on Prometrium 100 mg nightly.  Again discussed the risks versus benefits of HRT to include thrombosis such as stroke heart attack DVT in the breast cancer  issue.  Patient is comfortable continuing and I refilled her x1 year.  She will call me if she has any issues with increasing the estrogen dose.  She is done no bleeding and knows to call if she has any bleeding. 2. Mammography 07/2017.  Continue with annual mammography next year.  Breast exam normal today. 3. Pap smear/HPV 2018.  No Pap smear done today.  No history of significant abnormal Pap smears.  Plan repeat Pap smear at 5-year interval per current screening guidelines. 4. Colonoscopy 2017.  Repeat at their recommended interval. 5. DEXA never.  Will plan further in the menopause. 6. Health maintenance.  No routine lab work done as patient reports this done elsewhere.  Follow-up in 1 year, sooner as needed.   Anastasio Auerbach MD, 10:49 AM 09/04/2017

## 2017-11-24 ENCOUNTER — Other Ambulatory Visit: Payer: Self-pay | Admitting: Gynecology

## 2018-02-21 ENCOUNTER — Other Ambulatory Visit: Payer: Self-pay

## 2018-02-21 ENCOUNTER — Other Ambulatory Visit: Payer: Self-pay | Admitting: Internal Medicine

## 2018-02-21 MED ORDER — ALPRAZOLAM 1 MG PO TABS: ORAL_TABLET | ORAL | 0 refills | Status: DC

## 2018-04-01 NOTE — Progress Notes (Signed)
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    MVP (mitral valve prolapse) Monitor, no symptoms.    B12 deficiency - Vitamin B12 - NEED TO GET ON IT  Encounter for general adult medical examination with abnormal findings 1 year   Medication management - Magnesium  Anxiety  With flying, and likes to have AS needed will send in xanax   Discussed med's effects and SE's. Screening labs and tests as requested with regular follow-up as recommended.  HPI  53 y.o. female  presents for a complete physical.   Her blood pressure has been controlled at home, today their BP is BP: 118/64 She does not workout, she has stopped going tot he gym. She denies chest pain, shortness of breath, dizziness.    She is not on cholesterol medication and denies myalgias. Her cholesterol is at goal. The cholesterol last visit was:  Lab Results  Component Value Date   CHOL 263 (H) 03/22/2017   HDL 107 03/22/2017   LDLCALC 132 (H) 03/22/2017   TRIG 125 03/22/2017   CHOLHDL 2.5 03/22/2017    Last A1C in the office was: 5.2  Patient is on Vitamin D supplement, 5000 IU daily.  Lab Results  Component Value Date   VD25OH 48 03/22/2017   B12 , is on B12 sublingual Lab Results  Component Value Date   VITAMINB12 294 03/22/2017   She is on Wellbutrin 300 XL daily, she states it is helping her for depression.   She is going through menopause, on progesterone and estrogen patch, follows with Dr. Phineas Real.  BMI is Body mass index is 25.81  kg/m., she is working on diet and exercise. Frustrated with weight, feels with menopause she is gaining more weight.   Wt Readings from Last 3 Encounters:  04/02/18 164 lb 12.8 oz (74.8 kg)  09/04/17 161 lb (73 kg)  03/22/17 162 lb 4.8 oz (73.6 kg)   Current Medications:  Current Outpatient Medications on File Prior to Visit  Medication Sig Dispense Refill  . ALPRAZolam (XANAX) 1 MG tablet Take 1/2 to 1 tablet 1 or 2 x /day if needed for Anxiety or Sleep 30 tablet 0  . aspirin 81 MG chewable tablet Chew 81 mg by mouth daily.    Marland Kitchen buPROPion (WELLBUTRIN XL) 300 MG 24 hr tablet TAKE 1 TABLET BY MOUTH EVERY DAY FOR MOOD 90 tablet 3  . Cholecalciferol (VITAMIN D PO) Take by mouth.    . estradiol (VIVELLE-DOT) 0.075 MG/24HR Place 1 patch onto the skin 2 (two) times a week. 24 patch 4  . Fexofenadine HCl (ALLEGRA PO) Take 180 mg by mouth daily.    . progesterone (PROMETRIUM) 100 MG capsule Take 1 capsule (100 mg total) by mouth at bedtime. 90 capsule 4   No current facility-administered medications on file prior to visit.    Health Maintenance:   Immunization History  Administered Date(s) Administered  . Influenza Split 02/02/2014  . Tdap 04/02/2018  . Zoster Recombinat (Shingrix) 02/01/2018   Tetanus: 2020 Pneumovax: 2000 Prevnar 13: NA Flu vaccine: 2019 at [facility] Shingrix: 02/01/2018  LMP: None, had ablation  Pap: 08/2016 Dr. Phineas Real q 3-5 year MGM:07/2017 CAT B DEXA: N/A Colonoscopy:  07/2015 EGD: N/A Last Dental Exam: Dr. Enis Gash q 6 mons Last Eye Exam:Dr. Thom Chimes 2018  [name], MD as PCP - General (Internal Medicine)  Medical History:  Past Medical History:  Diagnosis Date  . Abnormal FSH level 03/2012   value 29.9  . Allergy   . Anxiety   . Depression with anxiety   . Hyperlipidemia   . MVP (mitral valve prolapse)    Antibiotics required prior to dental procedures  . 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 Vitamin D deficiency    Allergies No Known Allergies  SURGICAL HISTORY She  has a past surgical history that includes Endometrial ablation (10/2006); Cesarean section; Dilation and curettage of uterus; and Augmentation mammaplasty (Bilateral, 1989). FAMILY HISTORY Her family history includes Diabetes in her mother; Heart disease in her mother; Hyperlipidemia in her father and mother; Hypertension in her father and mother. SOCIAL HISTORY She  reports that she has quit smoking. She has never used smokeless tobacco. She reports current alcohol use. She reports that she does not use drugs.  Married, has 1 year old daughter. Graduated from [employer]  Review of Systems: Review of Systems  Constitutional: Negative.   HENT: Negative.   Eyes: Negative.   Respiratory: Negative.   Cardiovascular: Negative.   Gastrointestinal: Negative.   Genitourinary: Negative.   Musculoskeletal: Negative.   Skin: Negative.   Neurological: Negative.   Endo/Heme/Allergies: Negative.   Psychiatric/Behavioral: Negative.    Physical Exam: Estimated body mass index is 25.81 kg/m as calculated from the following:   Height as of this encounter: 5\' 7"  (1.702 m).   Weight as of this encounter: 164 lb 12.8 oz (74.8 kg). BP 118/64   Pulse 68   Temp (!) 97.4 F (36.3 C)   Ht 5\' 7"  (1.702 m)   Wt 164 lb 12.8 oz (74.8 kg)   SpO2 96%   BMI 25.81 kg/m  General Appearance: Well nourished, in no apparent distress.  Eyes: PERRLA, EOMs, conjunctiva no swelling or erythema, normal fundi and vessels.  Sinuses: No Frontal/maxillary tenderness  ENT/Mouth: Ext aud canals clear, normal light reflex with TMs without erythema, bulging. Good dentition. No erythema, swelling, or exudate on post pharynx. Tonsils not swollen or erythematous. Hearing normal.  Neck: Supple, thyroid normal. No bruits  Respiratory: Respiratory effort normal, BS equal bilaterally without rales, rhonchi, wheezing  or stridor.  Cardio: RRR without murmurs, rubs or gallops. Brisk peripheral pulses without edema.  Chest: symmetric, with normal excursions and percussion.  Breasts: defer.  Abdomen: Soft, nontender, no guarding, rebound, hernias, masses, or organomegaly. Lymphatics: Non tender without lymphadenopathy.  Genitourinary: defer Musculoskeletal: Full ROM all peripheral extremities,5/5 strength, and normal gait.  Skin: Warm, dry without rashes, lesions, ecchymosis. Neuro: Cranial nerves intact, reflexes equal bilaterally. Normal muscle tone, no cerebellar symptoms. Sensation intact.  Psych: Awake and oriented X 3, normal affect, Insight and Judgment appropriate.   EKG:  IRBBB, no ST changes   Vicie Mutters 9:59 AM [facility]

## 2018-04-02 ENCOUNTER — Encounter: Payer: Self-pay | Admitting: Physician Assistant

## 2018-04-02 ENCOUNTER — Ambulatory Visit: Payer: Commercial Managed Care - PPO | Admitting: Physician Assistant

## 2018-04-02 VITALS — BP 118/64 | HR 68 | Temp 97.4°F | Ht 67.0 in | Wt 164.8 lb

## 2018-04-02 DIAGNOSIS — I10 Essential (primary) hypertension: Secondary | ICD-10-CM | POA: Diagnosis not present

## 2018-04-02 DIAGNOSIS — E785 Hyperlipidemia, unspecified: Secondary | ICD-10-CM | POA: Diagnosis not present

## 2018-04-02 DIAGNOSIS — I341 Nonrheumatic mitral (valve) prolapse: Secondary | ICD-10-CM

## 2018-04-02 DIAGNOSIS — F325 Major depressive disorder, single episode, in full remission: Secondary | ICD-10-CM

## 2018-04-02 DIAGNOSIS — Z23 Encounter for immunization: Secondary | ICD-10-CM

## 2018-04-02 DIAGNOSIS — Z0001 Encounter for general adult medical examination with abnormal findings: Secondary | ICD-10-CM

## 2018-04-02 DIAGNOSIS — Z136 Encounter for screening for cardiovascular disorders: Secondary | ICD-10-CM

## 2018-04-02 DIAGNOSIS — Z1389 Encounter for screening for other disorder: Secondary | ICD-10-CM

## 2018-04-02 DIAGNOSIS — Z0000 Encounter for general adult medical examination without abnormal findings: Secondary | ICD-10-CM

## 2018-04-02 DIAGNOSIS — Z79899 Other long term (current) drug therapy: Secondary | ICD-10-CM | POA: Diagnosis not present

## 2018-04-02 DIAGNOSIS — F411 Generalized anxiety disorder: Secondary | ICD-10-CM

## 2018-04-02 DIAGNOSIS — E559 Vitamin D deficiency, unspecified: Secondary | ICD-10-CM

## 2018-04-02 DIAGNOSIS — E538 Deficiency of other specified B group vitamins: Secondary | ICD-10-CM

## 2018-04-02 NOTE — Patient Instructions (Signed)
Increase exercise Try night time meditation for sleep If this does not help we can try a medication  Know what a healthy weight is for you (roughly BMI <25) and aim to maintain this  Aim for 7+ servings of fruits and vegetables daily  65-80+ fluid ounces of water or unsweet tea for healthy kidneys  Limit to max 1 drink of alcohol per day; avoid smoking/tobacco  Limit animal fats in diet for cholesterol and heart health - choose grass fed whenever available  Avoid highly processed foods, and foods high in saturated/trans fats  Aim for low stress - take time to unwind and care for your mental health  Aim for 150 min of moderate intensity exercise weekly for heart health, and weights twice weekly for bone health  Aim for 7-9 hours of sleep daily   Vitamin B12 Deficiency Vitamin B12 deficiency occurs when the body does not have enough vitamin B12. Vitamin B12 is an important vitamin. The body needs vitamin B12:  To make red blood cells.  To make DNA. This is the genetic material inside cells.  To help the nerves work properly so they can carry messages from the brain to the body. Vitamin B12 deficiency can cause various health problems, such as a low red blood cell count (anemia) or nerve damage. What are the causes? This condition may be caused by:  Not eating enough foods that contain vitamin B12.  Not having enough stomach acid and digestive fluids to properly absorb vitamin B12 from the food that you eat.  Certain digestive system diseases that make it hard to absorb vitamin B12. These diseases include Crohn disease, chronic pancreatitis, and cystic fibrosis.  Pernicious anemia. This is a condition in which the body does not make enough of a protein (intrinsic factor), resulting in too few red blood cells.  Having a surgery in which part of the stomach or small intestine is removed.  Taking certain medicines that make it hard for the body to absorb vitamin B12. These  medicines include: ? Heartburn medicine (antacids and proton pump inhibitors). ? An antibiotic medicine called neomycin. ? Some medicines that are used to treat diabetes, tuberculosis, gout, or high cholesterol. What increases the risk? The following factors may make you more likely to develop a B12 deficiency:  Being older than age 46.  Eating a vegetarian or vegan diet, especially while you are pregnant.  Eating a poor diet while you are pregnant.  Taking certain drugs.  Having alcoholism. What are the signs or symptoms? In some cases, there are no symptoms of this condition. If the condition leads to anemia or nerve damage, various symptoms can occur, such as:  Weakness.  Fatigue.  Loss of appetite.  Weight loss.  Numbness or tingling in your hands and feet.  Redness and burning of the tongue.  Confusion or memory problems.  Depression.  Sensory problems, such as color blindness, ringing in the ears, or loss of taste.  Diarrhea or constipation.  Trouble walking. If anemia is severe, symptoms can include:  Shortness of breath.  Dizziness.  Rapid heart rate (tachycardia).  How is this diagnosed? This condition may be diagnosed with a blood test to measure the level of vitamin B12 in your blood. You may have other tests to help find the cause of your vitamin B12 deficiency. These tests may include:  A complete blood count (CBC). This is a group of tests that measure certain characteristics of blood cells.  A blood test to measure  intrinsic factor.  An endoscopy. In this procedure, a thin tube with a camera on the end is used to look into your stomach or intestines. How is this treated? Treatment for this condition depends on the cause. Common treatment options include:  Changing your eating and drinking habits, such as: ? Eating more foods that contain vitamin B12. ? Drinking less alcohol or no alcohol.  Taking vitamin B12 supplements. Your health care  provider will tell you which dosage is best for you.  Getting vitamin B12 injections. Follow these instructions at home:  Take supplements only as told by your health care provider. Follow the directions carefully.  Get any injections that are prescribed by your health care provider.  Do not miss your appointments.  Eat lots of healthy foods that contain vitamin B12. Ask your health care provider if you should work with a dietitian. Foods that contain vitamin B12 include: ? Meat. ? Meat from birds (poultry). ? Fish. ? Eggs. ? Cereal and dairy products that are fortified. This means that vitamin B12 has been added to the food. Check the label on the package to see if the food is fortified.  Do not abuse alcohol.  Keep all follow-up visits as told by your health care provider. This is important. Contact a health care provider if:  Your symptoms come back. Get help right away if:  You develop shortness of breath.  You have chest pain.  You become dizzy or you lose consciousness. This information is not intended to replace advice given to you by your health care provider. Make sure you discuss any questions you have with your health care provider. Document Released: 05/29/2011 Document Revised: 08/18/2015 Document Reviewed: 07/22/2014 Elsevier Interactive Patient Education  2019 [employer].

## 2018-04-03 LAB — COMPLETE METABOLIC PANEL WITH GFR
AG Ratio: 1.6 (calc) (ref 1.0–2.5)
ALT: 11 U/L (ref 6–29)
AST: 17 U/L (ref 10–35)
Albumin: 4.2 g/dL (ref 3.6–5.1)
Alkaline phosphatase (APISO): 78 U/L (ref 33–130)
BILIRUBIN TOTAL: 0.7 mg/dL (ref 0.2–1.2)
BUN: 14 mg/dL (ref 7–25)
CO2: 26 mmol/L (ref 20–32)
Calcium: 9.2 mg/dL (ref 8.6–10.4)
Chloride: 104 mmol/L (ref 98–110)
Creat: 0.8 mg/dL (ref 0.50–1.05)
GFR, Est African American: 98 mL/min/{1.73_m2} (ref >=60)
GFR, Est Non African American: 84 mL/min/{1.73_m2} (ref >=60)
Globulin: 2.6 g/dL (calc) (ref 1.9–3.7)
Glucose, Bld: 85 mg/dL (ref 65–99)
Potassium: 4.3 mmol/L (ref 3.5–5.3)
Sodium: 141 mmol/L (ref 135–146)
TOTAL PROTEIN: 6.8 g/dL (ref 6.1–8.1)

## 2018-04-03 LAB — URINALYSIS, ROUTINE W REFLEX MICROSCOPIC
Bilirubin Urine: NEGATIVE
Glucose, UA: NEGATIVE
Hgb urine dipstick: NEGATIVE
Ketones, ur: NEGATIVE
Leukocytes, UA: NEGATIVE
Nitrite: NEGATIVE
PROTEIN: NEGATIVE
Specific Gravity, Urine: 1.022 (ref 1.001–1.03)
pH: 7 (ref 5.0–8.0)

## 2018-04-03 LAB — CBC WITH DIFFERENTIAL/PLATELET
Absolute Monocytes: 262 {cells}/uL (ref 200–950)
Basophils Absolute: 22 {cells}/uL (ref 0–200)
Basophils Relative: 0.5 %
Eosinophils Absolute: 90 {cells}/uL (ref 15–500)
Eosinophils Relative: 2.1 %
HCT: 39.3 % (ref 35.0–45.0)
Hemoglobin: 13.2 g/dL (ref 11.7–15.5)
Lymphs Abs: 1127 {cells}/uL (ref 850–3900)
MCH: 32 pg (ref 27.0–33.0)
MCHC: 33.6 g/dL (ref 32.0–36.0)
MCV: 95.2 fL (ref 80.0–100.0)
MPV: 10.1 fL (ref 7.5–12.5)
Monocytes Relative: 6.1 %
Neutro Abs: 2799 {cells}/uL (ref 1500–7800)
Neutrophils Relative %: 65.1 %
Platelets: 253 Thousand/uL (ref 140–400)
RBC: 4.13 Million/uL (ref 3.80–5.10)
RDW: 11.4 % (ref 11.0–15.0)
Total Lymphocyte: 26.2 %
WBC: 4.3 Thousand/uL (ref 3.8–10.8)

## 2018-04-03 LAB — LIPID PANEL
Cholesterol: 241 mg/dL — ABNORMAL HIGH (ref <200)
HDL: 89 mg/dL (ref >50)
LDL Cholesterol (Calc): 131 mg/dL (calc) — ABNORMAL HIGH
Non-HDL Cholesterol (Calc): 152 mg/dL (calc) — ABNORMAL HIGH (ref <130)
Total CHOL/HDL Ratio: 2.7 (calc) (ref <5.0)
Triglycerides: 106 mg/dL (ref <150)

## 2018-04-03 LAB — MICROALBUMIN / CREATININE URINE RATIO
Creatinine, Urine: 213 mg/dL (ref 20–275)
Microalb Creat Ratio: 3 ug/mg{creat}
Microalb, Ur: 0.7 mg/dL

## 2018-04-03 LAB — VITAMIN D 25 HYDROXY (VIT D DEFICIENCY, FRACTURES): Vit D, 25-Hydroxy: 53 ng/mL (ref 30–100)

## 2018-04-03 LAB — VITAMIN B12: Vitamin B-12: >2000 pg/mL — ABNORMAL HIGH (ref 200–1100)

## 2018-04-03 LAB — IRON, TOTAL/TOTAL IRON BINDING CAP
%SAT: 32 % (ref 16–45)
Iron: 103 ug/dL (ref 45–160)
TIBC: 323 ug/dL (ref 250–450)

## 2018-04-03 LAB — TSH: TSH: 1.79 mIU/L

## 2018-04-03 LAB — MAGNESIUM: Magnesium: 1.9 mg/dL (ref 1.5–2.5)

## 2018-04-10 ENCOUNTER — Other Ambulatory Visit: Payer: Self-pay | Admitting: Internal Medicine

## 2018-04-18 ENCOUNTER — Other Ambulatory Visit: Payer: Self-pay | Admitting: Physician Assistant

## 2018-04-18 DIAGNOSIS — F418 Other specified anxiety disorders: Secondary | ICD-10-CM

## 2018-06-10 ENCOUNTER — Other Ambulatory Visit: Payer: Self-pay | Admitting: Gynecology

## 2018-06-10 DIAGNOSIS — Z1231 Encounter for screening mammogram for malignant neoplasm of breast: Secondary | ICD-10-CM

## 2018-07-29 ENCOUNTER — Ambulatory Visit: Payer: Commercial Managed Care - PPO

## 2018-08-23 ENCOUNTER — Other Ambulatory Visit: Payer: Self-pay

## 2018-08-23 ENCOUNTER — Ambulatory Visit
Admission: RE | Admit: 2018-08-23 | Discharge: 2018-08-23 | Disposition: A | Discharge status: Home / Self Care | Payer: Commercial Managed Care - PPO | Source: Ambulatory Visit | Attending: Gynecology | Admitting: Gynecology

## 2018-08-23 DIAGNOSIS — Z1231 Encounter for screening mammogram for malignant neoplasm of breast: Secondary | ICD-10-CM

## 2018-08-26 ENCOUNTER — Other Ambulatory Visit: Payer: Self-pay | Admitting: Gynecology

## 2018-08-26 DIAGNOSIS — R928 Other abnormal and inconclusive findings on diagnostic imaging of breast: Secondary | ICD-10-CM

## 2018-08-29 ENCOUNTER — Other Ambulatory Visit: Payer: Self-pay | Admitting: Gynecology

## 2018-08-29 ENCOUNTER — Ambulatory Visit
Admission: RE | Admit: 2018-08-29 | Discharge: 2018-08-29 | Disposition: A | Discharge status: Home / Self Care | Payer: Commercial Managed Care - PPO | Source: Ambulatory Visit | Attending: Gynecology | Admitting: Gynecology

## 2018-08-29 ENCOUNTER — Other Ambulatory Visit: Payer: Self-pay

## 2018-08-29 DIAGNOSIS — R928 Other abnormal and inconclusive findings on diagnostic imaging of breast: Secondary | ICD-10-CM

## 2018-09-06 ENCOUNTER — Other Ambulatory Visit: Payer: Self-pay

## 2018-09-07 ENCOUNTER — Other Ambulatory Visit: Payer: Self-pay | Admitting: Gynecology

## 2018-09-09 ENCOUNTER — Encounter: Payer: Self-pay | Admitting: Gynecology

## 2018-09-09 ENCOUNTER — Ambulatory Visit (INDEPENDENT_AMBULATORY_CARE_PROVIDER_SITE_OTHER): Payer: Commercial Managed Care - PPO | Admitting: Gynecology

## 2018-09-09 ENCOUNTER — Other Ambulatory Visit: Payer: Self-pay

## 2018-09-09 VITALS — BP 118/74 | Ht 67.0 in | Wt 162.0 lb

## 2018-09-09 DIAGNOSIS — Z79890 Hormone replacement therapy (postmenopausal): Secondary | ICD-10-CM | POA: Diagnosis not present

## 2018-09-09 DIAGNOSIS — Z01419 Encounter for gynecological examination (general) (routine) without abnormal findings: Secondary | ICD-10-CM

## 2018-09-09 MED ORDER — PROGESTERONE MICRONIZED 100 MG PO CAPS: 100.0000 mg | ORAL_CAPSULE | Freq: Every day | ORAL | 4 refills | Status: DC

## 2018-09-09 MED ORDER — ESTRADIOL 0.075 MG/24HR TD PTTW: MEDICATED_PATCH | TRANSDERMAL | 4 refills | Status: DC

## 2018-09-09 NOTE — Patient Instructions (Signed)
Follow-up in 1 year, sooner as needed. 

## 2018-09-09 NOTE — Progress Notes (Signed)
    Mary [name] 1965-12-11 [id number]        53 y.o.  G1P1001 for annual gynecologic exam.  Without gynecologic complaints.  Past medical history,surgical history, problem list, medications, allergies, family history and social history were all reviewed and documented as reviewed in the EPIC chart.  ROS:  Performed with pertinent positives and negatives included in the history, assessment and plan.   Additional significant findings : None   Exam: Caryn Bee assistant Vitals:   09/09/18 1015  BP: 118/74  Weight: 162 lb (73.5 kg)  Height: 5\' 7"  (1.702 m)   Body mass index is 25.37 kg/m.  General appearance:  Normal affect, orientation and appearance. Skin: Grossly normal HEENT: Without gross lesions.  No cervical or supraclavicular adenopathy. Thyroid normal.  Lungs:  Clear without wheezing, rales or rhonchi Cardiac: RR, without RMG Abdominal:  Soft, nontender, without masses, guarding, rebound, organomegaly or hernia Breasts:  Examined lying and sitting without masses, retractions, discharge or axillary adenopathy.  Bilateral implants noted Pelvic:  Ext, BUS, Vagina: Normal  Cervix: Normal  Uterus: Anteverted, normal size, shape and contour, midline and mobile nontender   Adnexa: Without masses or tenderness    Anus and perineum: Normal   Rectovaginal: Normal sphincter tone without palpated masses or tenderness.    Assessment/Plan:  53 y.o. [name] female for annual gynecologic exam.   1. Postmenopausal/HRT.  Is on estradiol 0.075 patches and Prometrium 100 mg nightly.  Was increased from 0.05 mg patches last year due to symptoms and she is done great with this.  No bleeding.  We again discussed risks versus benefits to include stroke heart attack DVT in the breast cancer issue.  Refill x1 year provided.  Call if any bleeding or other issues. 2. Pap smear/HPV 2018.  No Pap smear done today.  No history of significant abnormal Pap smears.  Plan repeat Pap smear/HPV at 5-year  interval per current screening guidelines. 3. Mammography 08/2018.  Continue with annual mammography when due.  Breast exam normal today.  SBE monthly reviewed. 4. Colonoscopy 2017.  Repeat at their recommended interval. 5. DEXA never.  Will plan repeat further in the menopause. 6. Health maintenance.  No routine lab work done as patient does this elsewhere.  Follow-up 1 year, sooner as needed.   Anastasio Auerbach MD, 10:37 AM 09/09/2018

## 2018-09-28 ENCOUNTER — Other Ambulatory Visit: Payer: Self-pay | Admitting: Physician Assistant

## 2018-09-28 DIAGNOSIS — F418 Other specified anxiety disorders: Secondary | ICD-10-CM

## 2018-12-11 ENCOUNTER — Encounter: Payer: Self-pay | Admitting: Gynecology

## 2019-03-31 ENCOUNTER — Other Ambulatory Visit: Payer: Self-pay | Admitting: Internal Medicine

## 2019-03-31 DIAGNOSIS — F418 Other specified anxiety disorders: Secondary | ICD-10-CM

## 2019-04-08 NOTE — Progress Notes (Signed)
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    MVP (mitral valve prolapse) Monitor, no symptoms.    B12 deficiency - Vitamin B12  Encounter for general adult medical examination with abnormal findings 1 year   Medication management - Magnesium  Anxiety  With flying, and likes to have AS needed will send in xanax- uses 30 in a year.   Insomnia  good sleep hygiene discussed, increase day time activity, try melatonin or benadryl if this does not help we will call in sleep medication.    Discussed med's effects and SE's. Screening labs and tests as requested with regular follow-up as recommended.  HPI  54 y.o. Mary [name]  presents for a complete physical.  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   Mary blood pressure has been controlled at home, today their BP is BP: 118/72 Mary [name] does not workout, Mary [name] has stopped going tot he gym. Mary [name] denies chest pain, shortness of breath, dizziness.    Mary [name] is not on cholesterol medication, mom passed from Mi at 20 and denies myalgias. Mary cholesterol is at goal. The cholesterol last visit was:  Lab Results  Component Value Date   CHOL 241 (H) 04/02/2018   HDL 89 04/02/2018   LDLCALC 131 (H) 04/02/2018   TRIG 106 04/02/2018   CHOLHDL 2.7 04/02/2018    Last A1C in the office was: 5.2   Patient is on Vitamin D supplement, 5000 IU daily.  Lab Results  Component Value Date   VD25OH 53 04/02/2018   B12 , is on B12 sublingual Lab Results  Component Value Date   VITAMINB12 >2,000 (H)  04/02/2018   Mary [name] is on Wellbutrin 300 XL daily, Mary [name] states it is helping Mary for depression.   Mary [name] is going through menopause, on progesterone and estrogen patch, follows with Mary Mary [name], going to have new doctor next year in the practice since they are retiring.  BMI is Body mass index is 24.63 kg/m., Mary [name] is working on diet and exercise.  Wt Readings from Last 3 Encounters:  04/10/19 162 lb (73.5 kg)  09/09/18 162 lb (73.5 kg)  04/02/18 164 lb 12.8 oz (74.8 kg)   Current Medications:  Current Outpatient Medications on File Prior to Visit  Medication Sig Dispense Refill  . ALPRAZolam (XANAX) 1 MG tablet TAKE 1/2 TO 1 TABLET 1-2 TIMES PER DAY IF NEEDED FOR ANXIETY OR SLEEP 30 tablet 0  . aspirin 81 MG chewable tablet Chew 81 mg by mouth daily.    Marland Kitchen buPROPion (WELLBUTRIN XL) 300 MG 24 hr tablet Take 1 tablet Daily for Mood 90 tablet 3  . Cholecalciferol (VITAMIN D PO) Take by mouth.    . estradiol (DOTTI) 0.075 MG/24HR PLACE 1 PATCH ONTO THE SKIN 2 (TWO) TIMES A WEEK 24 patch 4  . Fexofenadine HCl (ALLEGRA PO) Take 180 mg by mouth daily.    . progesterone (PROMETRIUM) Mary MG capsule Take 1 capsule (Mary mg total) by mouth at bedtime. 90 capsule 4   No current facility-administered medications on file prior to visit.   Health Maintenance:   Immunization History  Administered  Date(s) Administered  . Influenza Split 02/02/2014  . Influenza-Unspecified 01/16/2019  . Tdap 04/02/2018  . Zoster Recombinat (Shingrix) 02/01/2018, 05/03/2018   Tetanus: 2020 Pneumovax: 2000 Prevnar 13: NA Flu vaccine: 12/2018 at [facility] Shingrix: completed 2019  LMP: None, had ablation Pap: 08/2016 Mary Mary [name] (retired in Dec will see someone) q 3-5 year MGM:08/2018 CAT B DEXA: N/A Colonoscopy:  07/2015 due next year, Dr. Silverio Decamp EGD: N/A Last Dental Exam: Dr. Enis Gash q 6 mons Last Eye Exam:Dr. Thom Chimes 2018  [name], MD as PCP - General (Internal Medicine)  Medical  History:  Past Medical History:  Diagnosis Date  . Abnormal FSH level 03/2012   value 29.9  . Allergy   . Anxiety   . Depression with anxiety   . Hyperlipidemia   . MVP (mitral valve prolapse)    Antibiotics required prior to dental procedures  . 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 Vitamin D deficiency    Allergies No Known Allergies  SURGICAL HISTORY Mary [name]  has a past surgical history that includes Endometrial ablation (10/2006); Cesarean section; Dilation and curettage of uterus; and Augmentation mammaplasty (Bilateral, 1989). FAMILY HISTORY Mary family history includes Diabetes in Mary Mary [name]; Heart disease in Mary Mary [name]; Hyperlipidemia in Mary Mary [name] and [name]; Hypertension in Mary Mary [name] and [name]. SOCIAL HISTORY Mary [name]  reports that Mary [name] has quit smoking. Mary [name] has never used smokeless tobacco. Mary [name] reports current alcohol use. Mary [name] reports that Mary [name] does not use drugs.  Married, has 54 year old [name]. 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   Review of Systems: Review of Systems  Constitutional: Negative.   HENT: Negative.   Eyes: Negative.   Respiratory: Negative.   Cardiovascular: Negative.   Gastrointestinal: Negative.   Genitourinary: Negative.   Musculoskeletal: Negative.   Skin: Negative.   Neurological: Negative.   Endo/Heme/Allergies: Negative.   Psychiatric/Behavioral: Negative.    Physical Exam: Estimated body mass index is 24.63 kg/m as calculated from the following:   Height as of this encounter: 5\' 8"  (1.727 m).   Weight as of this encounter: 162 lb (73.5 kg). BP 118/72   Pulse 74   Temp 97.6 F (36.4 C)   Ht 5\' 8"  (1.727 m)   Wt 162 lb (73.5 kg)   SpO2 96%   BMI 24.63 kg/m  General Appearance: Well nourished, in no apparent distress.  Eyes: PERRLA, EOMs, conjunctiva no swelling or erythema, normal fundi and vessels.  Sinuses: No Frontal/maxillary tenderness   ENT/Mouth: Ext aud canals clear, normal light reflex with TMs without erythema, bulging. Good dentition. No erythema, swelling, or exudate on post pharynx. Tonsils not swollen or erythematous. Hearing normal.  Neck: Supple, thyroid normal. No bruits  Respiratory: Respiratory effort normal, BS equal bilaterally without rales, rhonchi, wheezing or stridor.  Cardio: RRR without murmurs, rubs or gallops. Brisk peripheral pulses without edema.  Chest: symmetric, with normal excursions and percussion.  Breasts: defer.  Abdomen: Soft, nontender, no guarding, rebound, hernias, masses, or organomegaly. Lymphatics: Non tender without lymphadenopathy.  Genitourinary: defer Musculoskeletal: Full ROM all peripheral extremities,5/5 strength, and normal gait.  Skin: Warm, dry without rashes, lesions, ecchymosis. Neuro: Cranial nerves intact, reflexes equal bilaterally. Normal muscle tone, no cerebellar symptoms. Sensation intact.  Psych: Awake and oriented X 3, normal affect, Insight and Judgment appropriate.   EKG:  IRBBB, no ST changes   Vicie Mutters 9:55 AM [facility]

## 2019-04-10 ENCOUNTER — Other Ambulatory Visit: Payer: Self-pay

## 2019-04-10 ENCOUNTER — Ambulatory Visit: Payer: Commercial Managed Care - PPO | Admitting: Physician Assistant

## 2019-04-10 ENCOUNTER — Encounter: Payer: Self-pay | Admitting: Physician Assistant

## 2019-04-10 VITALS — BP 118/72 | HR 74 | Temp 97.6°F | Ht 68.0 in | Wt 162.0 lb

## 2019-04-10 DIAGNOSIS — F325 Major depressive disorder, single episode, in full remission: Secondary | ICD-10-CM

## 2019-04-10 DIAGNOSIS — I10 Essential (primary) hypertension: Secondary | ICD-10-CM | POA: Diagnosis not present

## 2019-04-10 DIAGNOSIS — Z8249 Family history of ischemic heart disease and other diseases of the circulatory system: Secondary | ICD-10-CM

## 2019-04-10 DIAGNOSIS — Z1389 Encounter for screening for other disorder: Secondary | ICD-10-CM

## 2019-04-10 DIAGNOSIS — E538 Deficiency of other specified B group vitamins: Secondary | ICD-10-CM

## 2019-04-10 DIAGNOSIS — Z0000 Encounter for general adult medical examination without abnormal findings: Secondary | ICD-10-CM | POA: Diagnosis not present

## 2019-04-10 DIAGNOSIS — I341 Nonrheumatic mitral (valve) prolapse: Secondary | ICD-10-CM

## 2019-04-10 DIAGNOSIS — F411 Generalized anxiety disorder: Secondary | ICD-10-CM

## 2019-04-10 DIAGNOSIS — Z0001 Encounter for general adult medical examination with abnormal findings: Secondary | ICD-10-CM

## 2019-04-10 DIAGNOSIS — E785 Hyperlipidemia, unspecified: Secondary | ICD-10-CM

## 2019-04-10 DIAGNOSIS — Z79899 Other long term (current) drug therapy: Secondary | ICD-10-CM

## 2019-04-10 DIAGNOSIS — Z136 Encounter for screening for cardiovascular disorders: Secondary | ICD-10-CM

## 2019-04-10 DIAGNOSIS — Z131 Encounter for screening for diabetes mellitus: Secondary | ICD-10-CM

## 2019-04-10 DIAGNOSIS — E559 Vitamin D deficiency, unspecified: Secondary | ICD-10-CM

## 2019-04-10 MED ORDER — ALPRAZOLAM 1 MG PO TABS: ORAL_TABLET | ORAL | 0 refills | Status: DC

## 2019-04-10 NOTE — Patient Instructions (Addendum)
WATER IS IMPORTANT  Being dehydrated can hurt your kidneys, cause fatigue, headaches, muscle aches, joint pain, and dry skin/nails so please increase your fluids.   Drink 80-100 oz a day of water, measure it out! Eat 3 meals a day, have to do breakfast, eat protein- hard boiled eggs, protein bar like [place] protein bar, greek yogurt like oikos triple zero, chobani 100, or light n fit greek  Can check out plantnanny app on your phone to help you keep track of your water  Try the melatonin 5mg -20mg  dissolvable or gummy 30 mins before bed   11 Tips to Follow:  1. No caffeine after 3pm: Avoid beverages with caffeine (soda, tea, energy drinks, etc.) especially after 3pm. 2. Don't go to bed hungry: Have your evening meal at least 3 hrs. before going to sleep. It's fine to have a small bedtime snack such as a glass of milk and a few crackers but don't have a big meal. 3. Have a nightly routine before bed: Plan on "winding down" before you go to sleep. Begin relaxing about 1 hour before you go to bed. Try doing a quiet activity such as listening to calming music, reading a book or meditating. 4. Turn off the TV and ALL electronics including video games, tablets, laptops, etc. 1 hour before sleep, and keep them out of the bedroom. 5. Turn off your cell phone and all notifications (new email and text alerts) or even better, leave your phone outside your room while you sleep. Studies have shown that a part of your brain continues to respond to certain lights and sounds even while you're still asleep. 6. Make your bedroom quiet, dark and cool. If you can't control the noise, try wearing earplugs or using a fan to block out other sounds. 7. Practice relaxation techniques. Try reading a book or meditating or drain your brain by writing a list of what you need to do the next day. LOOK AT APPS 8. Don't nap unless you feel sick: you'll have a better night's sleep. 9. Don't smoke, or quit if you do.  Nicotine, alcohol, and marijuana can all keep you awake. Talk to your health care provider if you need help with substance use. 10. Most importantly, wake up at the same time every day (or within 1 hour of your usual wake up time) EVEN on the weekends. A regular wake up time promotes sleep hygiene and prevents sleep problems. 11. Reduce exposure to bright light in the last three hours of the day before going to sleep. Maintaining good sleep hygiene and having good sleep habits lower your risk of developing sleep problems. Getting better sleep can also improve your concentration and alertness. Try the simple steps in this guide. If you still have trouble getting enough rest, make an appointment with your health care provider.     About Constipation  Constipation Overview Constipation is the most common gastrointestinal complaint - about 4 million Americans experience constipation and make 2.5 million physician visits a year to get help for the problem.  Constipation can occur when the colon absorbs too much water, the colon's muscle contraction is slow or sluggish, and/or there is delayed transit time through the colon.  The result is stool that is hard and dry.  Indicators of constipation include straining during bowel movements greater than 25% of the time, having fewer than three bowel movements per week, and/or the feeling of incomplete evacuation.  There are established guidelines ([place] II ) for defining constipation.  A person needs to have two or more of the following symptoms for at least 12 weeks (not necessarily consecutive) in the preceding 12 months: . Straining in  greater than 25% of bowel movements . Lumpy or hard stools in greater than 25% of bowel movements . Sensation of incomplete emptying in greater than 25% of bowel movements . Sensation of anorectal obstruction/blockade in greater than 25% of bowel movements . Manual maneuvers to help empty greater than 25% of bowel movements  (e.g., digital evacuation, support of the pelvic floor)  . Less than  3 bowel movements/week . Loose stools are not present, and criteria for irritable bowel syndrome are insufficient  Common Causes of Constipation . Lack of fiber in your diet . Lack of physical activity . Medications, including iron and calcium supplements  . Dairy intake . Dehydration . Abuse of laxatives  Travel  Irritable Bowel Syndrome  Pregnancy  Luteal phase of menstruation (after ovulation and before menses)  Colorectal problems  Intestinal Dysfunction  Treating Constipation  There are several ways of treating constipation, including changes to diet and exercise, use of laxatives, adjustments to the pelvic floor, and scheduled toileting.  These treatments include: . increasing fiber and fluids in the diet  . increasing physical activity . learning muscle coordination   learning proper toileting techniques and toileting modifications   designing and sticking  to a toileting schedule     2007, [employer] Doc.22

## 2019-04-11 LAB — COMPLETE METABOLIC PANEL WITH GFR
AG Ratio: 1.8 (calc) (ref 1.0–2.5)
ALT: 8 U/L (ref 6–29)
AST: 13 U/L (ref 10–35)
Albumin: 4.4 g/dL (ref 3.6–5.1)
Alkaline phosphatase (APISO): 66 U/L (ref 37–153)
BUN: 16 mg/dL (ref 7–25)
CO2: 29 mmol/L (ref 20–32)
Calcium: 9.5 mg/dL (ref 8.6–10.4)
Chloride: 105 mmol/L (ref 98–110)
Creat: 0.81 mg/dL (ref 0.50–1.05)
GFR, Est African American: 95 mL/min/{1.73_m2} (ref >=60)
GFR, Est Non African American: 82 mL/min/{1.73_m2} (ref >=60)
Globulin: 2.5 g/dL (calc) (ref 1.9–3.7)
Glucose, Bld: 86 mg/dL (ref 65–99)
Potassium: 4.6 mmol/L (ref 3.5–5.3)
Sodium: 140 mmol/L (ref 135–146)
Total Bilirubin: 0.6 mg/dL (ref 0.2–1.2)
Total Protein: 6.9 g/dL (ref 6.1–8.1)

## 2019-04-11 LAB — CBC WITH DIFFERENTIAL/PLATELET
Absolute Monocytes: 303 cells/uL (ref 200–950)
Basophils Absolute: 30 cells/uL (ref 0–200)
Basophils Relative: 0.8 %
Eosinophils Absolute: 93 cells/uL (ref 15–500)
Eosinophils Relative: 2.5 %
HCT: 40.4 % (ref 35.0–45.0)
Hemoglobin: 13.5 g/dL (ref 11.7–15.5)
Lymphs Abs: 1254 cells/uL (ref 850–3900)
MCH: 31.8 pg (ref 27.0–33.0)
MCHC: 33.4 g/dL (ref 32.0–36.0)
MCV: 95.3 fL (ref 80.0–100.0)
MPV: 9.7 fL (ref 7.5–12.5)
Monocytes Relative: 8.2 %
Neutro Abs: 2020 cells/uL (ref 1500–7800)
Neutrophils Relative %: 54.6 %
Platelets: 235 10*3/uL (ref 140–400)
RBC: 4.24 10*6/uL (ref 3.80–5.10)
RDW: 11.7 % (ref 11.0–15.0)
Total Lymphocyte: 33.9 %
WBC: 3.7 10*3/uL — ABNORMAL LOW (ref 3.8–10.8)

## 2019-04-11 LAB — URINALYSIS, ROUTINE W REFLEX MICROSCOPIC
Bilirubin Urine: NEGATIVE
Glucose, UA: NEGATIVE
Hgb urine dipstick: NEGATIVE
Leukocytes,Ua: NEGATIVE
Nitrite: NEGATIVE
Protein, ur: NEGATIVE
Specific Gravity, Urine: 1.023 (ref 1.001–1.03)
pH: 7 (ref 5.0–8.0)

## 2019-04-11 LAB — MAGNESIUM: Magnesium: 1.9 mg/dL (ref 1.5–2.5)

## 2019-04-11 LAB — LIPID PANEL
Cholesterol: 252 mg/dL — ABNORMAL HIGH (ref <200)
HDL: 83 mg/dL (ref >=50)
LDL Cholesterol (Calc): 151 mg/dL (calc) — ABNORMAL HIGH
Non-HDL Cholesterol (Calc): 169 mg/dL (calc) — ABNORMAL HIGH (ref <130)
Total CHOL/HDL Ratio: 3 (calc) (ref <5.0)
Triglycerides: 78 mg/dL (ref <150)

## 2019-04-11 LAB — HEMOGLOBIN A1C
Hgb A1c MFr Bld: 5 % of total Hgb (ref <5.7)
Mean Plasma Glucose: 97 (calc)
eAG (mmol/L): 5.4 (calc)

## 2019-04-11 LAB — VITAMIN B12: Vitamin B-12: >2000 pg/mL — ABNORMAL HIGH (ref 200–1100)

## 2019-04-11 LAB — TSH: TSH: 1.77 mIU/L

## 2019-04-11 LAB — MICROALBUMIN / CREATININE URINE RATIO
Creatinine, Urine: 261 mg/dL (ref 20–275)
Microalb Creat Ratio: 6 mcg/mg creat (ref <30)
Microalb, Ur: 1.5 mg/dL

## 2019-04-11 LAB — VITAMIN D 25 HYDROXY (VIT D DEFICIENCY, FRACTURES): Vit D, 25-Hydroxy: 66 ng/mL (ref 30–100)

## 2019-06-05 ENCOUNTER — Other Ambulatory Visit: Payer: Self-pay | Admitting: Internal Medicine

## 2019-06-05 ENCOUNTER — Other Ambulatory Visit: Payer: Self-pay | Admitting: *Deleted

## 2019-06-05 DIAGNOSIS — F418 Other specified anxiety disorders: Secondary | ICD-10-CM

## 2019-06-05 MED ORDER — ESTRADIOL 0.075 MG/24HR TD PTTW: MEDICATED_PATCH | TRANSDERMAL | 0 refills | Status: DC

## 2019-06-05 MED ORDER — BUPROPION HCL ER (XL) 300 MG PO TB24: ORAL_TABLET | ORAL | 3 refills | Status: DC

## 2019-06-05 MED ORDER — PROGESTERONE MICRONIZED 100 MG PO CAPS: 100.0000 mg | ORAL_CAPSULE | Freq: Every day | ORAL | 0 refills | Status: DC

## 2019-06-05 MED ORDER — ALPRAZOLAM 1 MG PO TABS: ORAL_TABLET | ORAL | 0 refills | Status: DC

## 2019-07-28 ENCOUNTER — Other Ambulatory Visit: Payer: Self-pay | Admitting: Obstetrics and Gynecology

## 2019-07-28 DIAGNOSIS — Z1231 Encounter for screening mammogram for malignant neoplasm of breast: Secondary | ICD-10-CM

## 2019-08-25 ENCOUNTER — Ambulatory Visit
Admission: RE | Admit: 2019-08-25 | Discharge: 2019-08-25 | Disposition: A | Discharge status: Home / Self Care | Payer: Commercial Managed Care - PPO | Source: Ambulatory Visit | Attending: Obstetrics and Gynecology | Admitting: Obstetrics and Gynecology

## 2019-08-25 ENCOUNTER — Other Ambulatory Visit: Payer: Self-pay

## 2019-08-25 DIAGNOSIS — Z1231 Encounter for screening mammogram for malignant neoplasm of breast: Secondary | ICD-10-CM

## 2019-09-12 ENCOUNTER — Other Ambulatory Visit: Payer: Self-pay | Admitting: *Deleted

## 2019-09-12 DIAGNOSIS — F418 Other specified anxiety disorders: Secondary | ICD-10-CM

## 2019-09-12 MED ORDER — BUPROPION HCL ER (XL) 300 MG PO TB24: ORAL_TABLET | ORAL | 3 refills | Status: DC

## 2020-04-12 ENCOUNTER — Encounter: Payer: Commercial Managed Care - PPO | Admitting: Adult Health Nurse Practitioner

## 2020-04-19 ENCOUNTER — Ambulatory Visit: Payer: Commercial Managed Care - PPO | Admitting: Adult Health Nurse Practitioner

## 2020-04-19 ENCOUNTER — Other Ambulatory Visit: Payer: Self-pay

## 2020-04-19 ENCOUNTER — Encounter: Payer: Self-pay | Admitting: Adult Health Nurse Practitioner

## 2020-04-19 VITALS — BP 100/68 | HR 67 | Temp 97.5°F | Ht 67.5 in | Wt 164.0 lb

## 2020-04-19 DIAGNOSIS — Z0001 Encounter for general adult medical examination with abnormal findings: Secondary | ICD-10-CM

## 2020-04-19 DIAGNOSIS — E538 Deficiency of other specified B group vitamins: Secondary | ICD-10-CM

## 2020-04-19 DIAGNOSIS — E559 Vitamin D deficiency, unspecified: Secondary | ICD-10-CM

## 2020-04-19 DIAGNOSIS — Z130 Encounter for screening for diseases of the blood and blood-forming organs and certain disorders involving the immune mechanism: Secondary | ICD-10-CM

## 2020-04-19 DIAGNOSIS — Z1329 Encounter for screening for other suspected endocrine disorder: Secondary | ICD-10-CM

## 2020-04-19 DIAGNOSIS — F325 Major depressive disorder, single episode, in full remission: Secondary | ICD-10-CM

## 2020-04-19 DIAGNOSIS — Z131 Encounter for screening for diabetes mellitus: Secondary | ICD-10-CM

## 2020-04-19 DIAGNOSIS — E785 Hyperlipidemia, unspecified: Secondary | ICD-10-CM

## 2020-04-19 DIAGNOSIS — I341 Nonrheumatic mitral (valve) prolapse: Secondary | ICD-10-CM

## 2020-04-19 DIAGNOSIS — F411 Generalized anxiety disorder: Secondary | ICD-10-CM

## 2020-04-19 DIAGNOSIS — F418 Other specified anxiety disorders: Secondary | ICD-10-CM

## 2020-04-19 DIAGNOSIS — Z0000 Encounter for general adult medical examination without abnormal findings: Secondary | ICD-10-CM | POA: Diagnosis not present

## 2020-04-19 DIAGNOSIS — Z1321 Encounter for screening for nutritional disorder: Secondary | ICD-10-CM

## 2020-04-19 DIAGNOSIS — Z8249 Family history of ischemic heart disease and other diseases of the circulatory system: Secondary | ICD-10-CM

## 2020-04-19 DIAGNOSIS — Z1389 Encounter for screening for other disorder: Secondary | ICD-10-CM

## 2020-04-19 DIAGNOSIS — Z136 Encounter for screening for cardiovascular disorders: Secondary | ICD-10-CM

## 2020-04-19 MED ORDER — ALPRAZOLAM 1 MG PO TABS: ORAL_TABLET | ORAL | 0 refills | Status: DC

## 2020-04-19 MED ORDER — BUPROPION HCL ER (XL) 300 MG PO TB24: ORAL_TABLET | ORAL | 3 refills | Status: DC

## 2020-04-19 NOTE — Progress Notes (Signed)
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    MVP (mitral valve prolapse) Monitor, no symptoms.    B12 deficiency - Vitamin B12   Medication management - Magnesium  Anxiety  With flying, and likes to have AS needed will send in xanax- uses 30 in a year.   Insomnia  good sleep hygiene discussed, increase day time activity, try melatonin or benadryl if this does not help we will call in sleep medication.   Family History of heart disease Control blood pressure, lipids and glucose Disscused lifestyle modifications, diet & exercise Continue to monitor  Discussed med's effects and SE's. Screening labs and tests as requested with regular follow-up as recommended.  HPI  55 y.o. female  presents for a complete physical.  She reports overall she is doing well.  She has not health or medication concerns today.  Daughter is going to law school [employer].    Her blood pressure has been controlled at home, today their BP is BP: 100/68 She does not workout, she has stopped going tot he gym. She denies chest pain, shortness of breath, dizziness.    She is not on cholesterol medication, mom passed from MI at 76 and denies myalgias. Her cholesterol is at goal. The cholesterol last visit was:  Lab Results  Component Value Date   CHOL 252 (H) 04/10/2019   HDL 83 04/10/2019   LDLCALC 151 (H) 04/10/2019   TRIG 78 04/10/2019   CHOLHDL 3.0  04/10/2019    Last A1C in the office was: 5.2.  Reports drinking Diet Pepsi, too many and not enough water.  Discussed drinking glass of water for every pepsi.  Patient is on Vitamin D supplement, 5000 IU daily.  Lab Results  Component Value Date   VD25OH 66 04/10/2019   B12 , is on B12 sublingual Lab Results  Component Value Date   VITAMINB12 >2,000 (H) 04/10/2019   She is on Wellbutrin 300 XL daily, she states it is helping her for depression.   She is going through menopause, on progesterone and estrogen patch, follows with Dr. Phineas Real, going to have new doctor next year in the practice since retiring. Planning to wean off of the patch this year.  BMI is Body mass index is 25.31 kg/m., she is working on diet and exercise.  Wt Readings from Last 3 Encounters:  04/19/20 164 lb (74.4 kg)  04/10/19 162 lb (73.5 kg)  09/09/18 162 lb (73.5 kg)   Current Medications:  Current Outpatient Medications on File Prior to Visit  Medication Sig Dispense Refill  . aspirin 81 MG chewable tablet Chew 81 mg by mouth daily.    . Cholecalciferol (VITAMIN D PO) Take by mouth.    . estradiol (DOTTI) 0.075 MG/24HR PLACE 1 PATCH ONTO THE SKIN 2 (TWO) TIMES A WEEK 24 patch 0  . Fexofenadine HCl (ALLEGRA PO) Take 180 mg by mouth daily.    . progesterone (PROMETRIUM) 100 MG capsule Take 1 capsule (100 mg total) by mouth at bedtime. 90 capsule 0  No current facility-administered medications on file prior to visit.   Health Maintenance:   Immunization History  Administered Date(s) Administered  . Influenza Split 02/02/2014  . Influenza-Unspecified 01/16/2019  . Tdap 04/02/2018  . Zoster Recombinat (Shingrix) 02/01/2018, 05/03/2018   Tetanus: 2020 Pneumovax: 2000 Prevnar 13: NA Flu vaccine: 12/2018 at [facility] Shingrix: completed 2019  LMP: None, had ablation Pap: 08/2016 Dr. Phineas Real (retired in Dec will see someone) q 3-5 year MGM:08/2018 CAT B DEXA: N/A Colonoscopy:  07/2015 due next year,  Dr. Silverio Decamp EGD: N/A Last Dental Exam: Dr. Enis Gash q [place] Exam:Dr. Thom Chimes 2021 Dermatology yearly: 2021  [name], MD as PCP - General (Internal Medicine)  Medical History:  Past Medical History:  Diagnosis Date  . Abnormal FSH level 03/2012   value 29.9  . Allergy   . Anxiety   . Depression with anxiety   . Hyperlipidemia   . MVP (mitral valve prolapse)    Antibiotics required prior to dental procedures  . 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 Vitamin D deficiency    Allergies No Known Allergies  SURGICAL HISTORY She  has a past surgical history that includes Endometrial ablation (10/2006); Cesarean section; Dilation and curettage of uterus; and Augmentation mammaplasty (Bilateral, 1989). FAMILY HISTORY Her family history includes Diabetes in her mother; Heart disease in her mother; Hyperlipidemia in her father and mother; Hypertension in her father and mother. SOCIAL HISTORY She  reports that she has quit smoking. She has never used smokeless tobacco. She reports current alcohol use. She reports that she does not use drugs.  Married, has 38 year old daughter. 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   Review of Systems: Review of Systems  Constitutional: Negative.   HENT: Negative.   Eyes: Negative.   Respiratory: Negative.   Cardiovascular: Negative.   Gastrointestinal: Negative.   Genitourinary: Negative.   Musculoskeletal: Negative.   Skin: Negative.   Neurological: Negative.   Endo/Heme/Allergies: Negative.   Psychiatric/Behavioral: Negative.    Physical Exam: Estimated body mass index is 25.31 kg/m as calculated from the following:   Height as of this encounter: 5' 7.5" (1.715 m).   Weight as of this encounter: 164 lb (74.4 kg). BP 100/68   Pulse 67   Temp (!) 97.5 F (36.4 C)   Ht 5' 7.5" (1.715 m)   Wt 164 lb (74.4 kg)   SpO2 98%    BMI 25.31 kg/m  General Appearance: Well nourished, in no apparent distress.  Eyes: PERRLA, EOMs, conjunctiva no swelling or erythema, normal fundi and vessels.  Sinuses: No Frontal/maxillary tenderness  ENT/Mouth: Ext aud canals clear, normal light reflex with TMs without erythema, bulging. Good dentition. No erythema, swelling, or exudate on post pharynx. Tonsils not swollen or erythematous. Hearing normal.  Neck: Supple, thyroid normal. No bruits  Respiratory: Respiratory effort normal, BS equal bilaterally without rales, rhonchi, wheezing or stridor.  Cardio: RRR without murmurs, rubs or gallops. Brisk peripheral pulses without edema.  Chest: symmetric, with normal excursions and percussion.  Breasts: defer.  Abdomen: Soft, nontender, no guarding, rebound, hernias, masses, or organomegaly. Lymphatics: Non tender without lymphadenopathy.  Genitourinary: defer Musculoskeletal: Full ROM all peripheral extremities,5/5 strength, and normal gait.  Skin: Warm, dry without rashes, lesions, ecchymosis. Neuro: Cranial nerves intact, reflexes equal bilaterally. Normal muscle tone, no cerebellar symptoms. Sensation intact.  Psych: Awake and oriented X 3, normal affect, Insight and  Judgment appropriate.   EKG:  IRBBB, no ST changes    Garnet Sierras, Laqueta Jean, DNP [facility] 04/19/2020  9:38 AM

## 2020-04-19 NOTE — Patient Instructions (Addendum)
   You are due for Colonoscopy, Contact to make an appointment.   [facility] Mauri Pole, MD in 2017.  [email or web address] [address], [place], [place] [address] [phone]     [place] what a healthy weight is for you (roughly BMI <25) and aim to maintain this  Aim for 7+ servings of fruits and vegetables daily  70-80+ fluid ounces of water or unsweet tea for healthy kidneys  Limit to max 1 drink of alcohol per day; avoid smoking/tobacco  Limit animal fats in diet for cholesterol and heart health - choose grass fed whenever available  Avoid highly processed foods, and foods high in saturated/trans fats  Aim for low stress - take time to unwind and care for your mental health  Aim for 150 min of moderate intensity exercise weekly for heart health, and weights twice weekly for bone health  Aim for 7-9 hours of sleep daily

## 2020-04-20 LAB — HEMOGLOBIN A1C
Hgb A1c MFr Bld: 5.1 % of total Hgb (ref <5.7)
Mean Plasma Glucose: 100 mg/dL
eAG (mmol/L): 5.5 mmol/L

## 2020-04-20 LAB — URINALYSIS W MICROSCOPIC + REFLEX CULTURE
Bacteria, UA: NONE SEEN /HPF
Bilirubin Urine: NEGATIVE
Glucose, UA: NEGATIVE
Hgb urine dipstick: NEGATIVE
Hyaline Cast: NONE SEEN /LPF
Ketones, ur: NEGATIVE
Leukocyte Esterase: NEGATIVE
Nitrites, Initial: NEGATIVE
Protein, ur: NEGATIVE
Specific Gravity, Urine: 1.02 (ref 1.001–1.03)
Squamous Epithelial / HPF: NONE SEEN /HPF (ref <=5)
WBC, UA: NONE SEEN /HPF (ref 0–5)
pH: 5.5 (ref 5.0–8.0)

## 2020-04-20 LAB — CBC WITH DIFFERENTIAL/PLATELET
Absolute Monocytes: 326 cells/uL (ref 200–950)
Basophils Absolute: 40 cells/uL (ref 0–200)
Basophils Relative: 0.9 %
Eosinophils Absolute: 158 cells/uL (ref 15–500)
Eosinophils Relative: 3.6 %
HCT: 39.5 % (ref 35.0–45.0)
Hemoglobin: 13.4 g/dL (ref 11.7–15.5)
Lymphs Abs: 1320 cells/uL (ref 850–3900)
MCH: 32.7 pg (ref 27.0–33.0)
MCHC: 33.9 g/dL (ref 32.0–36.0)
MCV: 96.3 fL (ref 80.0–100.0)
MPV: 9.9 fL (ref 7.5–12.5)
Monocytes Relative: 7.4 %
Neutro Abs: 2556 cells/uL (ref 1500–7800)
Neutrophils Relative %: 58.1 %
Platelets: 266 10*3/uL (ref 140–400)
RBC: 4.1 10*6/uL (ref 3.80–5.10)
RDW: 11.5 % (ref 11.0–15.0)
Total Lymphocyte: 30 %
WBC: 4.4 10*3/uL (ref 3.8–10.8)

## 2020-04-20 LAB — COMPLETE METABOLIC PANEL WITH GFR
AG Ratio: 1.6 (calc) (ref 1.0–2.5)
ALT: 8 U/L (ref 6–29)
AST: 14 U/L (ref 10–35)
Albumin: 4.4 g/dL (ref 3.6–5.1)
Alkaline phosphatase (APISO): 76 U/L (ref 37–153)
BUN: 20 mg/dL (ref 7–25)
CO2: 27 mmol/L (ref 20–32)
Calcium: 9.7 mg/dL (ref 8.6–10.4)
Chloride: 106 mmol/L (ref 98–110)
Creat: 0.82 mg/dL (ref 0.50–1.05)
GFR, Est African American: 93 mL/min/{1.73_m2} (ref >=60)
GFR, Est Non African American: 81 mL/min/{1.73_m2} (ref >=60)
Globulin: 2.7 g/dL (calc) (ref 1.9–3.7)
Glucose, Bld: 82 mg/dL (ref 65–99)
Potassium: 5.3 mmol/L (ref 3.5–5.3)
Sodium: 141 mmol/L (ref 135–146)
Total Bilirubin: 0.3 mg/dL (ref 0.2–1.2)
Total Protein: 7.1 g/dL (ref 6.1–8.1)

## 2020-04-20 LAB — LIPID PANEL
Cholesterol: 265 mg/dL — ABNORMAL HIGH (ref <200)
HDL: 90 mg/dL (ref >=50)
LDL Cholesterol (Calc): 152 mg/dL (calc) — ABNORMAL HIGH
Non-HDL Cholesterol (Calc): 175 mg/dL (calc) — ABNORMAL HIGH (ref <130)
Total CHOL/HDL Ratio: 2.9 (calc) (ref <5.0)
Triglycerides: 112 mg/dL (ref <150)

## 2020-04-20 LAB — NO CULTURE INDICATED

## 2020-04-20 LAB — VITAMIN B12: Vitamin B-12: 987 pg/mL (ref 200–1100)

## 2020-04-20 LAB — IRON, TOTAL/TOTAL IRON BINDING CAP
%SAT: 22 % (calc) (ref 16–45)
Iron: 65 ug/dL (ref 45–160)
TIBC: 296 mcg/dL (calc) (ref 250–450)

## 2020-04-20 LAB — VITAMIN D 25 HYDROXY (VIT D DEFICIENCY, FRACTURES): Vit D, 25-Hydroxy: 57 ng/mL (ref 30–100)

## 2020-04-20 LAB — TSH: TSH: 3.18 mIU/L

## 2020-07-19 ENCOUNTER — Ambulatory Visit: Payer: Commercial Managed Care - PPO | Admitting: Adult Health

## 2020-07-23 ENCOUNTER — Other Ambulatory Visit: Payer: Self-pay | Admitting: Internal Medicine

## 2020-07-23 DIAGNOSIS — Z1231 Encounter for screening mammogram for malignant neoplasm of breast: Secondary | ICD-10-CM

## 2020-08-20 ENCOUNTER — Ambulatory Visit: Payer: Commercial Managed Care - PPO | Admitting: Adult Health

## 2020-08-20 ENCOUNTER — Encounter: Payer: Self-pay | Admitting: Adult Health

## 2020-08-20 ENCOUNTER — Other Ambulatory Visit: Payer: Self-pay

## 2020-08-20 VITALS — BP 110/70 | HR 80 | Temp 97.3°F | Wt 162.0 lb

## 2020-08-20 DIAGNOSIS — N951 Menopausal and female climacteric states: Secondary | ICD-10-CM | POA: Diagnosis not present

## 2020-08-20 DIAGNOSIS — E785 Hyperlipidemia, unspecified: Secondary | ICD-10-CM

## 2020-08-20 DIAGNOSIS — E559 Vitamin D deficiency, unspecified: Secondary | ICD-10-CM | POA: Diagnosis not present

## 2020-08-20 DIAGNOSIS — F411 Generalized anxiety disorder: Secondary | ICD-10-CM

## 2020-08-20 MED ORDER — GABAPENTIN 100 MG PO CAPS: ORAL_CAPSULE | ORAL | 0 refills | Status: DC

## 2020-08-20 MED ORDER — GABAPENTIN 100 MG PO CAPS: 100.0000 mg | ORAL_CAPSULE | Freq: Every day | ORAL | 0 refills | Status: DC

## 2020-08-20 NOTE — Patient Instructions (Addendum)
Goals    . LDL CALC < 100       Less saturated fat - high in animal products, high fat dairy, eggs, palm oil, coconut milk/oil  Unsaturated fat (particularly polyunsaturated fatty acids/PUFA) are GOOD  Try to increase soluble fiber - black beans, old fashioned oats, raspberries, chia seeds, ground flax seeds are excellent sources, but all plants contain some    .

## 2020-08-20 NOTE — Progress Notes (Signed)
FOLLOW UP  Assessment and Plan:   Cholesterol Currently above goal, working on lifestyle Continue low cholesterol diet and exercise.  Discussed low saturated fat, high soluble fiber at length Check lipid panel.   BMI 25 Continue to recommend diet heavy in fruits and veggies and low in animal meats, cheeses, and dairy products, appropriate calorie intake Discuss exercise recommendations routinely Continue to monitor weight at each visit  Vitamin D Def At goal at last visit; continue supplementation to maintain goal of 60-100 Defer Vit D level  Anxiety Well managed by current regimen; declined med change; continue medications Stress management techniques discussed, increase water, good sleep hygiene discussed, increase exercise, and increase veggies.   Hot flashes, menopausal Insomia After discussion will try low dose gabapentin;  -     gabapentin (NEURONTIN) 100 MG capsule; Take 1-3 capsules (100-300 mg total) by mouth at bedtime.  Continue diet and meds as discussed. Further disposition pending results of labs. Discussed med's effects and SE's.   Over 30 minutes of exam, counseling, chart review, and critical decision making was performed.   Future Appointments  Date Time Provider [place]  09/17/2020 12:00 PM [facility] MM 2 [facility]  09/22/2020 10:30 AM [facility] PREVISIT RM 51 [facility]  10/06/2020  9:00 AM Nandigam, Venia Minks, MD [facility]  04/19/2021  9:00 AM McClanahan, Danton Sewer, NP [facility] None    ----------------------------------------------------------------------------------------------------------------------  HPI 55 y.o. female  presents for 3 month follow up on cholesterol, depression/anxiety, weight and vitamin D deficiency.   She has been on wellbutrin 300 mg for many years for depression/anxiety, has xanax 0.5-1 mg PRN, reports uses rarely.    Reports recently tapered off of HRT per GYN and having hot flashes, night time are  particularly bothersome and impeding sleep.   BMI is Body mass index is 25 kg/m., she has been working on diet and exercise, has been using rowing machine at home, doing 200 daily currently, notes losing inches but weight steady.  She is doing intermittent fasting, skipping breakfast Doing salads, admits poor choices on the weekend Wt Readings from Last 3 Encounters:  08/20/20 162 lb (73.5 kg)  04/19/20 164 lb (74.4 kg)  04/10/19 162 lb (73.5 kg)   Today their BP is BP: 110/70  She does workout. She denies chest pain, shortness of breath, dizziness.   She is not on cholesterol medication and denies myalgias. Her cholesterol is at goal, does have maternal vascular hx. The cholesterol last visit was:   Lab Results  Component Value Date   CHOL 265 (H) 04/19/2020   HDL 90 04/19/2020   LDLCALC 152 (H) 04/19/2020   TRIG 112 04/19/2020   CHOLHDL 2.9 04/19/2020   Last A1C in the office was:  Lab Results  Component Value Date   HGBA1C 5.1 04/19/2020   Patient is on Vitamin D supplement.   Lab Results  Component Value Date   VD25OH 94 04/19/2020     She is on B12 supplement Lab Results  Component Value Date   [id number] 098 04/19/2020      Current Medications:  Current Outpatient Medications on File Prior to Visit  Medication Sig  . ALPRAZolam (XANAX) 1 MG tablet TAKE 1/2 TO 1 TABLET 1-2 TIMES PER DAY IF NEEDED FOR ANXIETY OR SLEEP  . aspirin 81 MG chewable tablet Chew 81 mg by mouth daily.  Marland Kitchen buPROPion (WELLBUTRIN XL) 300 MG 24 hr tablet Take 1 tablet Daily for Mood  . Cholecalciferol (VITAMIN D PO) Take  by mouth.  . Fexofenadine HCl (ALLEGRA PO) Take 180 mg by mouth daily.  Marland Kitchen estradiol (DOTTI) 0.075 MG/24HR PLACE 1 PATCH ONTO THE SKIN 2 (TWO) TIMES A WEEK (Patient not taking: Reported on 08/20/2020)  . progesterone (PROMETRIUM) 100 MG capsule Take 1 capsule (100 mg total) by mouth at bedtime. (Patient not taking: Reported on 08/20/2020)   No current facility-administered  medications on file prior to visit.     Allergies: No Known Allergies   Medical History:  Past Medical History:  Diagnosis Date  . Abnormal FSH level 03/2012   value 29.9  . Allergy   . Anxiety   . Depression with anxiety   . Hyperlipidemia   . MVP (mitral valve prolapse)    Antibiotics required prior to dental procedures  . 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 Vitamin D deficiency    Family history- Reviewed and unchanged Social history- Reviewed and unchanged   Review of Systems:  Review of Systems  Constitutional: Positive for diaphoresis (since tapering off of HRT). Negative for malaise/fatigue and weight loss.  HENT: Negative for hearing loss and tinnitus.   Eyes: Negative for blurred vision and double vision.  Respiratory: Negative for cough, shortness of breath and wheezing.   Cardiovascular: Negative for chest pain, palpitations, orthopnea, claudication and leg swelling.  Gastrointestinal: Negative for abdominal pain, blood in stool, constipation, diarrhea, heartburn, melena, nausea and vomiting.  Genitourinary: Negative.   Musculoskeletal: Negative for joint pain and myalgias.  Skin: Negative for rash.  Neurological: Negative for dizziness, tingling, sensory change, weakness and headaches.  Endo/Heme/Allergies: Negative for polydipsia.  Psychiatric/Behavioral: Negative for depression and substance abuse. The patient has insomnia (since hot flashes, typically rare). The patient is not nervous/anxious.   All other systems reviewed and are negative.     Physical Exam: BP 110/70   Pulse 80   Temp (!) 97.3 F (36.3 C)   Wt 162 lb (73.5 kg)   SpO2 99%   BMI 25.00 kg/m  Wt Readings from Last 3 Encounters:  08/20/20 162 lb (73.5 kg)  04/19/20 164 lb (74.4 kg)  04/10/19 162 lb (73.5 kg)   General Appearance: Well nourished, in no apparent distress. Eyes: PERRLA, EOMs, conjunctiva no swelling or  erythema Sinuses: No Frontal/maxillary tenderness ENT/Mouth: Ext aud canals clear, TMs without erythema, bulging. No erythema, swelling, or exudate on post pharynx.  Tonsils not swollen or erythematous. Hearing normal.  Neck: Supple, thyroid normal.  Respiratory: Respiratory effort normal, BS equal bilaterally without rales, rhonchi, wheezing or stridor.  Cardio: RRR with no MRGs. Brisk peripheral pulses without edema.  Abdomen: Soft, + BS.  Non tender, no guarding, rebound, hernias, masses. Lymphatics: Non tender without lymphadenopathy.  Musculoskeletal: Full ROM, 5/5 strength, Normal gait Skin: Warm, dry without rashes, lesions, ecchymosis.  Neuro: Cranial nerves intact. No cerebellar symptoms.  Psych: Awake and oriented X 3, normal affect, Insight and Judgment appropriate.    Izora Ribas, NP 11:53 AM [facility]

## 2020-08-21 LAB — LIPID PANEL
Cholesterol: 257 mg/dL — ABNORMAL HIGH (ref <200)
HDL: 101 mg/dL (ref >=50)
LDL Cholesterol (Calc): 139 mg/dL (calc) — ABNORMAL HIGH
Non-HDL Cholesterol (Calc): 156 mg/dL (calc) — ABNORMAL HIGH (ref <130)
Total CHOL/HDL Ratio: 2.5 (calc) (ref <5.0)
Triglycerides: 80 mg/dL (ref <150)

## 2020-08-21 LAB — COMPLETE METABOLIC PANEL WITH GFR
AG Ratio: 1.7 (calc) (ref 1.0–2.5)
ALT: 12 U/L (ref 6–29)
AST: 16 U/L (ref 10–35)
Albumin: 4.5 g/dL (ref 3.6–5.1)
Alkaline phosphatase (APISO): 84 U/L (ref 37–153)
BUN: 18 mg/dL (ref 7–25)
CO2: 29 mmol/L (ref 20–32)
Calcium: 9.8 mg/dL (ref 8.6–10.4)
Chloride: 106 mmol/L (ref 98–110)
Creat: 0.73 mg/dL (ref 0.50–1.05)
GFR, Est African American: 107 mL/min/{1.73_m2} (ref >=60)
GFR, Est Non African American: 93 mL/min/{1.73_m2} (ref >=60)
Globulin: 2.6 g/dL (calc) (ref 1.9–3.7)
Glucose, Bld: 86 mg/dL (ref 65–99)
Potassium: 5.1 mmol/L (ref 3.5–5.3)
Sodium: 142 mmol/L (ref 135–146)
Total Bilirubin: 0.6 mg/dL (ref 0.2–1.2)
Total Protein: 7.1 g/dL (ref 6.1–8.1)

## 2020-09-17 ENCOUNTER — Other Ambulatory Visit: Payer: Self-pay | Admitting: Obstetrics and Gynecology

## 2020-09-17 ENCOUNTER — Ambulatory Visit
Admission: RE | Admit: 2020-09-17 | Discharge: 2020-09-17 | Disposition: A | Discharge status: Home / Self Care | Payer: Commercial Managed Care - PPO | Source: Ambulatory Visit | Attending: Internal Medicine | Admitting: Internal Medicine

## 2020-09-17 ENCOUNTER — Other Ambulatory Visit: Payer: Self-pay

## 2020-09-17 DIAGNOSIS — Z1231 Encounter for screening mammogram for malignant neoplasm of breast: Secondary | ICD-10-CM

## 2020-09-22 ENCOUNTER — Ambulatory Visit (AMBULATORY_SURGERY_CENTER): Payer: Commercial Managed Care - PPO | Admitting: *Deleted

## 2020-09-22 ENCOUNTER — Other Ambulatory Visit: Payer: Self-pay

## 2020-09-22 VITALS — Ht 67.5 in | Wt 160.0 lb

## 2020-09-22 DIAGNOSIS — Z86010 Personal history of colonic polyps: Secondary | ICD-10-CM

## 2020-09-22 MED ORDER — SUTAB 1479-225-188 MG PO TABS
1.0000 | ORAL_TABLET | Freq: Once | ORAL | 0 refills | Status: AC
Start: 2020-09-22 — End: 2020-09-22

## 2020-09-22 NOTE — Progress Notes (Signed)
Pt's previsit is done over the phone and all paperwork (prep instructions, blank consent form to just read over) sent to patient.  Pt's name and DOB verified at the beginning of the previsit.  Pt denies any difficulty with ambulating.    No trouble with anesthesia, denies being told they were difficult to intubate, or hx/fam hx of malignant hyperthermia per pt   No egg or soy allergy  No home oxygen use   No medications for weight loss taken  emmi information given  Pt denies constipation issues  Pt informed that we do not do prior authorizations for prep   Sutab code put into RX and paper copy given to pt to show pharmacy

## 2020-10-04 ENCOUNTER — Encounter: Payer: Self-pay | Admitting: Gastroenterology

## 2020-10-06 ENCOUNTER — Encounter: Payer: Self-pay | Admitting: Gastroenterology

## 2020-10-06 ENCOUNTER — Other Ambulatory Visit: Payer: Self-pay

## 2020-10-06 ENCOUNTER — Ambulatory Visit (AMBULATORY_SURGERY_CENTER): Payer: Commercial Managed Care - PPO | Admitting: Gastroenterology

## 2020-10-06 VITALS — BP 110/56 | HR 72 | Temp 97.1°F | Resp 11 | Ht 67.5 in | Wt 160.0 lb

## 2020-10-06 DIAGNOSIS — Z86010 Personal history of colonic polyps: Secondary | ICD-10-CM

## 2020-10-06 DIAGNOSIS — D122 Benign neoplasm of ascending colon: Secondary | ICD-10-CM | POA: Diagnosis not present

## 2020-10-06 DIAGNOSIS — K635 Polyp of colon: Secondary | ICD-10-CM | POA: Diagnosis not present

## 2020-10-06 DIAGNOSIS — D125 Benign neoplasm of sigmoid colon: Secondary | ICD-10-CM | POA: Diagnosis not present

## 2020-10-06 MED ORDER — SODIUM CHLORIDE 0.9 % IV SOLN: 500.0000 mL | Freq: Once | INTRAVENOUS | Status: DC

## 2020-10-06 NOTE — Progress Notes (Signed)
Called to room to assist during endoscopic procedure.  Patient ID and intended procedure confirmed with present staff. Received instructions for my participation in the procedure from the performing physician.  

## 2020-10-06 NOTE — Op Note (Signed)
[facility] Patient Name: Mary [name] Procedure Date: 10/06/2020 9:01 AM MRN: [id number] Endoscopist: Mauri Pole , MD Age: 55 Referring MD:  Date of Birth: 1965/10/24 Gender: Female Account #: [id number] Procedure:                Colonoscopy Indications:              High risk colon cancer surveillance: Personal                            history of colonic polyps, High risk colon cancer                            surveillance: Personal history of adenoma less than                            10 mm in size Medicines:                Monitored Anesthesia Care Procedure:                Pre-Anesthesia Assessment:                           - Prior to the procedure, a History and Physical                            was performed, and patient medications and                            allergies were reviewed. The patient's tolerance of                            previous anesthesia was also reviewed. The risks                            and benefits of the procedure and the sedation                            options and risks were discussed with the patient.                            All questions were answered, and informed consent                            was obtained. Prior Anticoagulants: The patient has                            taken no previous anticoagulant or antiplatelet                            agents. ASA Grade Assessment: II - A patient with                            mild systemic disease. After reviewing the risks  and benefits, the patient was deemed in                            satisfactory condition to undergo the procedure.                           After obtaining informed consent, the colonoscope                            was passed under direct vision. Throughout the                            procedure, the patient's blood pressure, pulse, and                            oxygen saturations were monitored continuously.  The                            0405 PCF-H190TL Slim SB Colonoscope was introduced                            through the anus and advanced to the the cecum,                            identified by appendiceal orifice and ileocecal                            valve. The colonoscopy was performed without                            difficulty. The patient tolerated the procedure                            well. The quality of the bowel preparation was                            good. The ileocecal valve, appendiceal orifice, and                            rectum were photographed. Scope In: 9:13:33 AM Scope Out: 9:28:35 AM Scope Withdrawal Time: 0 hours 11 minutes 7 seconds  Total Procedure Duration: 0 hours 15 minutes 2 seconds  Findings:                 The perianal and digital rectal examinations were                            normal.                           A less than 1 mm polyp was found in the ascending                            colon. The polyp was sessile. The polyp was removed  with a cold biopsy forceps. Resection and retrieval                            were complete.                           Two sessile polyps were found in the sigmoid colon                            and ascending colon. The polyps were 4 to 8 mm in                            size. These polyps were removed with a cold snare.                            Resection and retrieval were complete.                           Non-bleeding internal hemorrhoids were found during                            retroflexion. The hemorrhoids were medium-sized. Complications:            No immediate complications. Estimated Blood Loss:     Estimated blood loss was minimal. Impression:               - One less than 1 mm polyp in the ascending colon,                            removed with a cold biopsy forceps. Resected and                            retrieved.                           - Two 4 to 8  mm polyps in the sigmoid colon and in                            the ascending colon, removed with a cold snare.                            Resected and retrieved.                           - Non-bleeding internal hemorrhoids. Recommendation:           - Patient has a contact number available for                            emergencies. The signs and symptoms of potential                            delayed complications were discussed with the  patient. Return to normal activities tomorrow.                            Written discharge instructions were provided to the                            patient.                           - Resume previous diet.                           - Continue present medications.                           - Await pathology results.                           - Repeat colonoscopy in 3 - 5 years for                            surveillance based on pathology results. Mauri Pole, MD 10/06/2020 9:32:33 AM This report has been signed electronically.

## 2020-10-06 NOTE — Patient Instructions (Signed)
Handout given:  polyps, hemorrhoids Resume previous diet Continue current medications Await pathology results  YOU HAD AN ENDOSCOPIC PROCEDURE TODAY AT [facility]:   Refer to the procedure report that was given to you for any specific questions about what was found during the examination.  If the procedure report does not answer your questions, please call your gastroenterologist to clarify.  If you requested that your care partner not be given the details of your procedure findings, then the procedure report has been included in a sealed envelope for you to review at your convenience later.  YOU SHOULD EXPECT: Some feelings of bloating in the abdomen. Passage of more gas than usual.  Walking can help get rid of the air that was put into your GI tract during the procedure and reduce the bloating. If you had a lower endoscopy (such as a colonoscopy or flexible sigmoidoscopy) you may notice spotting of blood in your stool or on the toilet paper. If you underwent a bowel prep for your procedure, you may not have a normal bowel movement for a few days.  Please Note:  You might notice some irritation and congestion in your nose or some drainage.  This is from the oxygen used during your procedure.  There is no need for concern and it should clear up in a day or so.  SYMPTOMS TO REPORT IMMEDIATELY:  Following lower endoscopy (colonoscopy or flexible sigmoidoscopy):  Excessive amounts of blood in the stool  Significant tenderness or worsening of abdominal pains  Swelling of the abdomen that is new, acute  Fever of 100F or higher  For urgent or emergent issues, a gastroenterologist can be reached at any hour by calling [phone]. Do not use MyChart messaging for urgent concerns.   DIET:  We do recommend a small meal at first, but then you may proceed to your regular diet.  Drink plenty of fluids but you should avoid alcoholic beverages for 24 hours.  ACTIVITY:  You should plan  to take it easy for the rest of today and you should NOT DRIVE or use heavy machinery until tomorrow (because of the sedation medicines used during the test).    FOLLOW UP: Our staff will call the number listed on your records 48-72 hours following your procedure to check on you and address any questions or concerns that you may have regarding the information given to you following your procedure. If we do not reach you, we will leave a message.  We will attempt to reach you two times.  During this call, we will ask if you have developed any symptoms of COVID 19. If you develop any symptoms (ie: fever, flu-like symptoms, shortness of breath, cough etc.) before then, please call [phone].  If you test positive for Covid 19 in the 2 weeks post procedure, please call and report this information to [place].    If any biopsies were taken you will be contacted by phone or by letter within the next 1-3 weeks.  Please call us at [phone] if you have not heard about the biopsies in 3 weeks.   SIGNATURES/CONFIDENTIALITY: You and/or your care partner have signed paperwork which will be entered into your electronic medical record.  These signatures attest to the fact that that the information above on your After Visit Summary has been reviewed and is understood.  Full responsibility of the confidentiality of this discharge information lies with you and/or your care-partner.

## 2020-10-06 NOTE — Progress Notes (Signed)
Vs by CW in adm  Pt's states no medical or surgical changes since previsit or office visit.     

## 2020-10-06 NOTE — Progress Notes (Signed)
Report given to PACU, vss 

## 2020-10-08 ENCOUNTER — Telehealth: Payer: Self-pay

## 2020-10-08 NOTE — Telephone Encounter (Signed)
LVM

## 2020-10-25 ENCOUNTER — Encounter: Payer: Self-pay | Admitting: Gastroenterology

## 2021-02-03 ENCOUNTER — Other Ambulatory Visit: Payer: Self-pay | Admitting: Obstetrics and Gynecology

## 2021-02-03 DIAGNOSIS — N644 Mastodynia: Secondary | ICD-10-CM

## 2021-02-18 ENCOUNTER — Other Ambulatory Visit: Payer: Self-pay

## 2021-02-18 ENCOUNTER — Ambulatory Visit: Payer: Commercial Managed Care - PPO

## 2021-02-18 ENCOUNTER — Ambulatory Visit
Admission: RE | Admit: 2021-02-18 | Discharge: 2021-02-18 | Disposition: A | Discharge status: Home / Self Care | Payer: Commercial Managed Care - PPO | Source: Ambulatory Visit | Attending: Obstetrics and Gynecology | Admitting: Obstetrics and Gynecology

## 2021-02-18 DIAGNOSIS — N644 Mastodynia: Secondary | ICD-10-CM

## 2021-04-18 NOTE — Progress Notes (Signed)
Complete Physical   Assessment and Plan:  Mary [name] was seen today for annual exam.  Diagnoses and all orders for this visit:  Encounter for general adult medical examination with abnormal findings Due Yearly Mammogram and Colonoscopy are UTD  Hyperlipidemia, unspecified hyperlipidemia type Continue diet and exercise -     CBC with Differential/Platelet -     COMPLETE METABOLIC PANEL WITH GFR -     Lipid panel  Vitamin D deficiency Continue Vit D supplementation to maintain value in therapeutic level of 60-100  -     VITAMIN D 25 Hydroxy (Vit-D Deficiency, Fractures)  Anxiety state Continue behavior modifications, relaxation techniques, diet and exercise -     ALPRAZolam (XANAX) 1 MG tablet; TAKE 1/2 TO 1 TABLET 1-2 TIMES PER DAY IF NEEDED FOR ANXIETY OR SLEEP  Depression, major, in remission ([place]) Continue medication, diet ,exercise, relaxation techniques -     buPROPion (WELLBUTRIN XL) 300 MG 24 hr tablet; Take 1 tablet Daily for Mood  Hot flashes due to menopause Pt to try Estroven Off Estrogen patch Continue diet and exercise  Screening, iron deficiency anemia -CBC  Screening for blood or protein in urine -     Urinalysis, Routine w reflex microscopic -     Microalbumin / creatinine urine ratio  Screening for diabetes mellitus -     Hemoglobin A1c  Screening, ischemic heart disease/Palpitations Monitor symptoms Go to the ER if any chest pain, shortness of breath, nausea, dizziness, severe HA, changes vision/speech  -     EKG 12-Lead  Screening for thyroid disorder -     TSH  Medication management -     CBC with Differential/Platelet -     COMPLETE METABOLIC PANEL WITH GFR -     Magnesium -     Lipid panel -     TSH -     Hemoglobin A1c -     VITAMIN D 25 Hydroxy (Vit-D Deficiency, Fractures) -     EKG 12-Lead -     Urinalysis, Routine w reflex microscopic -     Microalbumin / creatinine urine ratio  Dyspnea on exertion -     albuterol (VENTOLIN HFA)  108 (90 Base) MCG/ACT inhaler; Inhale 2 puffs into the lungs every 6 (six) hours as needed for wheezing or shortness of breath.     Discussed med's effects and SE's. Screening labs and tests as requested with regular follow-up as recommended. Future Appointments  Date Time Provider [place]  04/19/2022  9:00 AM Magda Bernheim, NP [facility] None    HPI  56 y.o. female  presents for a complete physical.  She has been noticing that she is having shortness of breath on exertion.  She had RSV 12/18/20 and has been feeling fatigued and breathing difficulties since that time.  She does feel like her heart is racing/palpitations at times, feels some pressure intermittently.  Daughter is going to law school [employer].    Her blood pressure has been controlled at home, today their BP is BP: 110/68 BP Readings from Last 3 Encounters:  04/19/21 110/68  10/06/20 (!) 110/56  08/20/20 110/70    She does not workout, she has stopped going to the gym. She denies chest pain, shortness of breath, dizziness.    She is not on cholesterol medication, mom passed from MI at 30 and denies myalgias. Her cholesterol is at goal. The cholesterol last visit was:  Lab Results  Component Value Date   CHOL 257 (H) 08/20/2020  HDL 101 08/20/2020   LDLCALC 139 (H) 08/20/2020   TRIG 80 08/20/2020   CHOLHDL 2.5 08/20/2020    Last A1C in the office was: 5.2.  Reports drinking Diet Pepsi, too many and not enough water.  Discussed drinking glass of water for every pepsi.  Patient is on Vitamin D supplement, 5000 IU daily.  Lab Results  Component Value Date   VD25OH 57 04/19/2020   B12 , is on B12 sublingual Lab Results  Component Value Date   [id number] 04/19/2020   She is on Wellbutrin 300 XL daily, she states it is helping her for depression.   She is going through menopause hot flashes started back after stopping estrogen patch. , follows with [name]. She is having difficulty sleeping,  wakes frequently. Has been off estrogen patch.   BMI is Body mass index is 26.74 kg/m., she is working on diet and exercise. Has not been exercising- feels increased fatigue Wt Readings from Last 3 Encounters:  04/19/21 168 lb 3.2 oz (76.3 kg)  10/06/20 160 lb (72.6 kg)  09/22/20 160 lb (72.6 kg)   Current Medications:  Current Outpatient Medications on File Prior to Visit  Medication Sig Dispense Refill   ALPRAZolam (XANAX) 1 MG tablet TAKE 1/2 TO 1 TABLET 1-2 TIMES PER DAY IF NEEDED FOR ANXIETY OR SLEEP 30 tablet 0   aspirin 81 MG chewable tablet Chew 81 mg by mouth daily.     buPROPion (WELLBUTRIN XL) 300 MG 24 hr tablet Take 1 tablet Daily for Mood 90 tablet 3   Cholecalciferol (VITAMIN D PO) Take by mouth.     Cyanocobalamin (B-12 PO) Take by mouth.     Fexofenadine HCl (ALLEGRA PO) Take 180 mg by mouth daily.     Thiamine HCl (VITAMIN B-1 PO) Take by mouth daily. (Patient not taking: Reported on 04/19/2021)     No current facility-administered medications on file prior to visit.   Health Maintenance:   Immunization History  Administered Date(s) Administered   Influenza Split 02/02/2014   Influenza-Unspecified 01/16/2019, 12/28/2020   Moderna Sars-Covid-2 Vaccination 05/29/2019, 12/12/2019, 02/03/2020   Pfizer Covid-19 Vaccine Bivalent Booster 42yrs & up 12/28/2020   Tdap 04/02/2018   Zoster Recombinat (Shingrix) 02/01/2018, 05/03/2018   Tetanus: 2020 Pneumovax: 2000 Prevnar 13: NA Flu vaccine: 12/2020 Shingrix: completed 2019  LMP: None, had ablation Pap: 08/2016 Dr. Phineas Real (retired in Dec will see someone) q 3-5 year MGM:02/18/21 negative repeat 1 year DEXA: N/A Colonoscopy:  09/2020, Dr. Silverio Decamp EGD: N/A Last Dental Exam: Dr. Enis Gash q 6 mons Last Eye Exam:Dr. 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 FAMILY HISTORY Her family history includes Diabetes in her mother; Heart disease in her mother; Hyperlipidemia in her father and mother; Hypertension in her father and mother. SOCIAL HISTORY She  reports that she has quit smoking. She has never used  smokeless tobacco. She reports current alcohol use. She reports that she does not use drugs.  Married, has 49 year old daughter. 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   Review of Systems: Review of Systems  Constitutional: Negative.  Negative for chills and fever.       Hot flashes  HENT: Negative.  Negative for congestion, hearing loss, sinus pain, sore throat and tinnitus.   Eyes: Negative.  Negative for blurred vision and double vision.  Respiratory:  Positive for shortness of breath (on exertion). Negative for cough, hemoptysis, sputum production and wheezing.   Cardiovascular:  Positive for palpitations. Negative for chest pain and leg swelling.  Gastrointestinal: Negative.  Negative for abdominal pain, constipation, diarrhea, heartburn, nausea and vomiting.  Genitourinary: Negative.  Negative for dysuria and urgency.  Musculoskeletal: Negative.  Negative for back pain, falls, joint pain, myalgias and neck pain.  Skin: Negative.  Negative for rash.  Neurological: Negative.  Negative for dizziness, tingling, tremors, weakness and headaches.  Endo/Heme/Allergies: Negative.  Does not bruise/bleed easily.  Psychiatric/Behavioral: Negative.   Negative for depression and suicidal ideas. The patient is not nervous/anxious and does not have insomnia.   Physical Exam: Estimated body mass index is 26.74 kg/m as calculated from the following:   Height as of this encounter: 5' 6.5" (1.689 m).   Weight as of this encounter: 168 lb 3.2 oz (76.3 kg). BP 110/68    Pulse 98    Temp 97.7 F (36.5 C)    Ht 5' 6.5" (1.689 m)    Wt 168 lb 3.2 oz (76.3 kg)    SpO2 98%    BMI 26.74 kg/m  General Appearance: Well nourished, in no apparent distress.  Eyes: PERRLA, EOMs, conjunctiva no swelling or erythema, normal fundi and vessels.  Sinuses: No Frontal/maxillary tenderness  ENT/Mouth: Ext aud canals clear, normal light reflex with TMs without erythema, bulging. Good dentition. No erythema, swelling, or exudate on post pharynx. Tonsils not swollen or erythematous. Hearing normal.  Neck: Supple, thyroid normal. No bruits  Respiratory: Respiratory effort normal, BS equal bilaterally without rales, rhonchi, wheezing or stridor.  Cardio: RRR without murmurs, rubs or gallops. Brisk peripheral pulses without edema.  Chest: symmetric, with normal excursions and percussion.  Breasts: defer.  Abdomen: Soft, nontender, no guarding, rebound, hernias, masses, or organomegaly. Lymphatics: Non tender without lymphadenopathy.  Genitourinary: defer Musculoskeletal: Full ROM all peripheral extremities,5/5 strength, and normal gait.  Skin: Warm, dry without rashes, lesions, ecchymosis. Neuro: Cranial nerves intact, reflexes equal bilaterally. Normal muscle tone, no cerebellar symptoms. Sensation intact.  Psych: Awake and oriented X 3, normal affect, Insight and Judgment appropriate.   EKG:  NSR, no ST changes    Bayard Males, DNP [facility] 04/19/2021  9:09 AM

## 2021-04-19 ENCOUNTER — Ambulatory Visit: Payer: Commercial Managed Care - PPO | Admitting: Nurse Practitioner

## 2021-04-19 ENCOUNTER — Other Ambulatory Visit: Payer: Self-pay

## 2021-04-19 ENCOUNTER — Encounter: Payer: Self-pay | Admitting: Nurse Practitioner

## 2021-04-19 VITALS — BP 110/68 | HR 98 | Temp 97.7°F | Ht 66.5 in | Wt 168.2 lb

## 2021-04-19 DIAGNOSIS — E559 Vitamin D deficiency, unspecified: Secondary | ICD-10-CM

## 2021-04-19 DIAGNOSIS — F325 Major depressive disorder, single episode, in full remission: Secondary | ICD-10-CM

## 2021-04-19 DIAGNOSIS — Z130 Encounter for screening for diseases of the blood and blood-forming organs and certain disorders involving the immune mechanism: Secondary | ICD-10-CM

## 2021-04-19 DIAGNOSIS — F411 Generalized anxiety disorder: Secondary | ICD-10-CM

## 2021-04-19 DIAGNOSIS — N951 Menopausal and female climacteric states: Secondary | ICD-10-CM

## 2021-04-19 DIAGNOSIS — Z1389 Encounter for screening for other disorder: Secondary | ICD-10-CM

## 2021-04-19 DIAGNOSIS — Z79899 Other long term (current) drug therapy: Secondary | ICD-10-CM

## 2021-04-19 DIAGNOSIS — Z1329 Encounter for screening for other suspected endocrine disorder: Secondary | ICD-10-CM

## 2021-04-19 DIAGNOSIS — E785 Hyperlipidemia, unspecified: Secondary | ICD-10-CM

## 2021-04-19 DIAGNOSIS — Z136 Encounter for screening for cardiovascular disorders: Secondary | ICD-10-CM

## 2021-04-19 DIAGNOSIS — R0609 Other forms of dyspnea: Secondary | ICD-10-CM

## 2021-04-19 DIAGNOSIS — R002 Palpitations: Secondary | ICD-10-CM | POA: Diagnosis not present

## 2021-04-19 DIAGNOSIS — Z131 Encounter for screening for diabetes mellitus: Secondary | ICD-10-CM

## 2021-04-19 DIAGNOSIS — Z0001 Encounter for general adult medical examination with abnormal findings: Secondary | ICD-10-CM

## 2021-04-19 DIAGNOSIS — Z0000 Encounter for general adult medical examination without abnormal findings: Secondary | ICD-10-CM | POA: Diagnosis not present

## 2021-04-19 MED ORDER — ALBUTEROL SULFATE HFA 108 (90 BASE) MCG/ACT IN AERS: 2.0000 | INHALATION_SPRAY | Freq: Four times a day (QID) | RESPIRATORY_TRACT | 2 refills | Status: DC | PRN

## 2021-04-19 MED ORDER — BUPROPION HCL ER (XL) 300 MG PO TB24: ORAL_TABLET | ORAL | 3 refills | Status: DC

## 2021-04-19 MED ORDER — ALPRAZOLAM 1 MG PO TABS: ORAL_TABLET | ORAL | 0 refills | Status: DC

## 2021-04-19 NOTE — Patient Instructions (Signed)
Albuterol Metered Dose Inhaler (MDI) What is this medication? ALBUTEROL (al [place]) treats lung diseases, such as asthma, where the airways in the lungs narrow, causing breathing problems or wheezing (bronchospasm). It is also used to treat asthma or prevent breathing problems during exercise. This medication works by opening the airways of the lungs, making it easier to breathe. It is often called a rescue- or quick-relief inhaler. This medicine may be used for other purposes; ask your health care provider or pharmacist if you have questions. COMMON BRAND NAME(S): Proair HFA, Proventil, Proventil HFA, Respirol, Ventolin, Ventolin HFA What should I tell my care team before I take this medication? They need to know if you have any of these conditions: Diabetes (high blood sugar) Heart disease High blood pressure Irregular heartbeat or rhythm Pheochromocytoma Seizures Thyroid disease An unusual or allergic reaction to albuterol, other medications, foods, dyes, or preservatives Pregnant or trying to get pregnant Breast-feeding How should I use this medication? This medication is inhaled through the mouth. Take it as directed on the prescription label. Do not use it more often than directed. This medication comes with INSTRUCTIONS FOR USE. Ask your pharmacist for directions on how to use this medication. Read the information carefully. Talk to your pharmacist or care team if you have questions. Talk to your care team about the use of this medication in children. While it may be given to children for selected conditions, precautions do apply. Overdosage: If you think you have taken too much of this medicine contact a poison control center or emergency room at once. NOTE: This medicine is only for you. Do not share this medicine with others. What if I miss a dose? If you take this medication on a regular basis, take it as soon as you can. If it is almost time for your next dose, take only  that dose. Do not take double or extra doses. What may interact with this medication? Caffeine Chloroquine Cisapride Diuretics Medications for colds Medications for depression or for emotional or psychotic conditions Medications for weight loss including some herbal products Methadone Pentamidine Some antibiotics like clarithromycin, erythromycin, levofloxacin, and linezolid Some heart medications Steroid hormones like dexamethasone, cortisone, hydrocortisone Theophylline Thyroid hormones This list may not describe all possible interactions. Give your health care provider a list of all the medicines, herbs, non-prescription drugs, or dietary supplements you use. Also tell them if you smoke, drink alcohol, or use illegal drugs. Some items may interact with your medicine. What should I watch for while using this medication? Visit your care team for regular checks on your progress. Tell your care team if your symptoms do not start to get better or if they get worse. If your symptoms get worse or if you are using this medication more than normal, call your care team right away. Do not treat yourself for coughs, colds or allergies without asking your care team for advice. Some nonprescription medications can affect this one. You and your care team should develop an Asthma Action Plan that is just for you. Be sure to know what to do if you are in the yellow (asthma is getting worse) or red (medical alert) zones. Your mouth may get dry. Chewing sugarless gum or sucking hard candy and drinking plenty of water may help. Contact your health care provider if the problem does not go away or is severe. What side effects may I notice from receiving this medication? Side effects that you should report to your care team as  soon as possible: Allergic reactions--skin rash, itching, hives, swelling of the face, lips, tongue, or throat Heart rhythm changes--fast or irregular heartbeat, dizziness, feeling faint  or lightheaded, chest pain, trouble breathing Increase in blood pressure Muscle pain or cramps Wheezing or trouble breathing that is worse after use Side effects that usually do not require medical attention (report to your care team if they continue or are bothersome): Change in taste Dry mouth Headache Sore throat Tremors or shaking Trouble sleeping This list may not describe all possible side effects. Call your doctor for medical advice about side effects. You may report side effects to FDA at 1-800-FDA-1088. Where should I keep my medication? Keep out of the reach of children and pets. Store at room temperature between 20 and 25 degrees C (68 and 77 degrees F). Keep inhaler away from extreme heat and cold. Get rid of it when the dose counter reads 0 or after the expiration date, whichever is first. To get rid of medications that are no longer needed or have expired: Take the medication to a medication take-back program. Check with your pharmacy or law enforcement to find a location. If you cannot return the medication, ask your care team how to get rid of this medication safely. NOTE: This sheet is a summary. It may not cover all possible information. If you have questions about this medicine, talk to your doctor, pharmacist, or health care provider.  2022 Elsevier/Gold Standard (2020-02-20 00:00:00)

## 2021-04-20 LAB — URINALYSIS, ROUTINE W REFLEX MICROSCOPIC
Bacteria, UA: NONE SEEN /HPF
Bilirubin Urine: NEGATIVE
Glucose, UA: NEGATIVE
Hgb urine dipstick: NEGATIVE
Hyaline Cast: NONE SEEN /LPF
Ketones, ur: NEGATIVE
Nitrite: NEGATIVE
Protein, ur: NEGATIVE
RBC / HPF: NONE SEEN /HPF (ref 0–2)
Specific Gravity, Urine: 1.005 (ref 1.001–1.035)
Squamous Epithelial / HPF: NONE SEEN /HPF (ref <=5)
WBC, UA: NONE SEEN /HPF (ref 0–5)
pH: 6.5 (ref 5.0–8.0)

## 2021-04-20 LAB — CBC WITH DIFFERENTIAL/PLATELET
Absolute Monocytes: 302 cells/uL (ref 200–950)
Basophils Absolute: 50 cells/uL (ref 0–200)
Basophils Relative: 1.1 %
Eosinophils Absolute: 108 cells/uL (ref 15–500)
Eosinophils Relative: 2.4 %
HCT: 39.4 % (ref 35.0–45.0)
Hemoglobin: 13.4 g/dL (ref 11.7–15.5)
Lymphs Abs: 1391 cells/uL (ref 850–3900)
MCH: 31.3 pg (ref 27.0–33.0)
MCHC: 34 g/dL (ref 32.0–36.0)
MCV: 92.1 fL (ref 80.0–100.0)
MPV: 9.6 fL (ref 7.5–12.5)
Monocytes Relative: 6.7 %
Neutro Abs: 2651 cells/uL (ref 1500–7800)
Neutrophils Relative %: 58.9 %
Platelets: 262 10*3/uL (ref 140–400)
RBC: 4.28 10*6/uL (ref 3.80–5.10)
RDW: 12.2 % (ref 11.0–15.0)
Total Lymphocyte: 30.9 %
WBC: 4.5 10*3/uL (ref 3.8–10.8)

## 2021-04-20 LAB — COMPLETE METABOLIC PANEL WITH GFR
AG Ratio: 1.7 (calc) (ref 1.0–2.5)
ALT: 11 U/L (ref 6–29)
AST: 18 U/L (ref 10–35)
Albumin: 4.5 g/dL (ref 3.6–5.1)
Alkaline phosphatase (APISO): 105 U/L (ref 37–153)
BUN: 17 mg/dL (ref 7–25)
CO2: 30 mmol/L (ref 20–32)
Calcium: 10 mg/dL (ref 8.6–10.4)
Chloride: 105 mmol/L (ref 98–110)
Creat: 0.8 mg/dL (ref 0.50–1.03)
Globulin: 2.7 g/dL (calc) (ref 1.9–3.7)
Glucose, Bld: 88 mg/dL (ref 65–99)
Potassium: 4.9 mmol/L (ref 3.5–5.3)
Sodium: 141 mmol/L (ref 135–146)
Total Bilirubin: 0.4 mg/dL (ref 0.2–1.2)
Total Protein: 7.2 g/dL (ref 6.1–8.1)
eGFR: 86 mL/min/{1.73_m2} (ref >=60)

## 2021-04-20 LAB — TSH: TSH: 2.34 mIU/L (ref 0.40–4.50)

## 2021-04-20 LAB — MICROALBUMIN / CREATININE URINE RATIO
Creatinine, Urine: 39 mg/dL (ref 20–275)
Microalb Creat Ratio: 10 mcg/mg creat (ref <30)
Microalb, Ur: 0.4 mg/dL

## 2021-04-20 LAB — LIPID PANEL
Cholesterol: 250 mg/dL — ABNORMAL HIGH (ref <200)
HDL: 108 mg/dL (ref >=50)
LDL Cholesterol (Calc): 123 mg/dL (calc) — ABNORMAL HIGH
Non-HDL Cholesterol (Calc): 142 mg/dL (calc) — ABNORMAL HIGH (ref <130)
Total CHOL/HDL Ratio: 2.3 (calc) (ref <5.0)
Triglycerides: 86 mg/dL (ref <150)

## 2021-04-20 LAB — HEMOGLOBIN A1C
Hgb A1c MFr Bld: 5.3 % of total Hgb (ref <5.7)
Mean Plasma Glucose: 105 mg/dL
eAG (mmol/L): 5.8 mmol/L

## 2021-04-20 LAB — VITAMIN D 25 HYDROXY (VIT D DEFICIENCY, FRACTURES): Vit D, 25-Hydroxy: 61 ng/mL (ref 30–100)

## 2021-04-20 LAB — MAGNESIUM: Magnesium: 2 mg/dL (ref 1.5–2.5)

## 2021-04-21 ENCOUNTER — Other Ambulatory Visit: Payer: Self-pay | Admitting: Nurse Practitioner

## 2021-04-21 ENCOUNTER — Encounter: Payer: Self-pay | Admitting: Nurse Practitioner

## 2021-04-21 DIAGNOSIS — R002 Palpitations: Secondary | ICD-10-CM

## 2021-05-11 ENCOUNTER — Other Ambulatory Visit: Payer: Self-pay

## 2021-05-11 ENCOUNTER — Ambulatory Visit (INDEPENDENT_AMBULATORY_CARE_PROVIDER_SITE_OTHER): Payer: Commercial Managed Care - PPO | Admitting: Cardiology

## 2021-05-11 ENCOUNTER — Encounter: Payer: Self-pay | Admitting: Cardiology

## 2021-05-11 VITALS — BP 120/80 | HR 87 | Ht 66.0 in | Wt 169.6 lb

## 2021-05-11 DIAGNOSIS — E785 Hyperlipidemia, unspecified: Secondary | ICD-10-CM

## 2021-05-11 DIAGNOSIS — R0602 Shortness of breath: Secondary | ICD-10-CM | POA: Diagnosis not present

## 2021-05-11 DIAGNOSIS — I341 Nonrheumatic mitral (valve) prolapse: Secondary | ICD-10-CM | POA: Diagnosis not present

## 2021-05-11 DIAGNOSIS — R079 Chest pain, unspecified: Secondary | ICD-10-CM

## 2021-05-11 MED ORDER — METOPROLOL TARTRATE 100 MG PO TABS: ORAL_TABLET | ORAL | 0 refills | Status: DC

## 2021-05-11 NOTE — Patient Instructions (Addendum)
Medication Instructions:  Your physician recommends that you continue on your current medications as directed. Please refer to the Current Medication list given to you today.  *If you need a refill on your cardiac medications before your next appointment, please call your pharmacy*   Lab Work: Your physician recommends that you return for lab work in:  TODAY: BMET, [place] If you have labs (blood work) drawn today and your tests are completely normal, you will receive your results only by: MyChart Message (if you have [place]) OR A paper copy in the mail If you have any lab test that is abnormal or we need to change your treatment, we will call you to review the results.   Testing/Procedures: Your physician has requested that you have an echocardiogram. Echocardiography is a painless test that uses sound waves to create images of your heart. It provides your doctor with information about the size and shape of your heart and how well your hearts chambers and valves are working. This procedure takes approximately one hour. There are no restrictions for this procedure.    Your cardiac CT will be scheduled at one of the below locations:   [facility] [address] [place], [place] [address] [phone]   If scheduled at [facility], please arrive at the [facility] main entrance (entrance A) of [facility] 30 minutes prior to test start time. You can use the FREE valet parking offered at the main entrance (encouraged to control the heart rate for the test) Proceed to the [facility] Radiology Department (first floor) to check-in and test prep.  Please follow these instructions carefully (unless otherwise directed):  On the Night Before the Test: Be sure to Drink plenty of water. Do not consume any caffeinated/decaffeinated beverages or chocolate 12 hours prior to your test. Do not take any antihistamines 12 hours prior to your test.  On the Day of the  Test: Drink plenty of water until 1 hour prior to the test. Do not eat any food 4 hours prior to the test. You may take your regular medications prior to the test.  Take metoprolol (Lopressor) two hours prior to test. FEMALES- please wear underwire-free bra if available, avoid dresses & tight clothing      After the Test: Drink plenty of water. After receiving IV contrast, you may experience a mild flushed feeling. This is normal. On occasion, you may experience a mild rash up to 24 hours after the test. This is not dangerous. If this occurs, you can take Benadryl 25 mg and increase your fluid intake. If you experience trouble breathing, this can be serious. If it is severe call 911 IMMEDIATELY. If it is mild, please call our office. If you take any of these medications: Glipizide/Metformin, Avandament, Glucavance, please do not take 48 hours after completing test unless otherwise instructed.  We will call to schedule your test 2-4 weeks out understanding that some insurance companies will need an authorization prior to the service being performed.   For non-scheduling related questions, please contact the cardiac imaging nurse navigator should you have any questions/concerns: Mary [name], Cardiac Imaging Nurse Navigator Mary [name], Cardiac Imaging Nurse Navigator [facility] Direct Office Dial: [phone]   For scheduling needs, including cancellations and rescheduling, please call Mary [name], [phone].    Follow-Up: At [facility], you and your health needs are our priority.  As part of our continuing mission to provide you with exceptional heart care, we  have created designated Provider Care Teams.  These Care Teams include your primary Cardiologist (physician) and Advanced Practice Providers (APPs -  Physician Assistants and Nurse Practitioners) who all work together to provide you with the care you need, when you need it.  We recommend signing  up for the patient portal called "MyChart".  Sign up information is provided on this After Visit Summary.  MyChart is used to connect with patients for Virtual Visits (Telemedicine).  Patients are able to view lab/test results, encounter notes, upcoming appointments, etc.  Non-urgent messages can be sent to your provider as well.   To learn more about what you can do with MyChart, go to [email or web address].    Your next appointment:   4 month(s)  The format for your next appointment:   In Person  Provider:   Berniece Salines, DO     Other Instructions  [email or web address] [email or web address]        FDA-cleared, clinical grade mobile EKG monitor: Mary [name] is the most clinically-validated mobile EKG used by the world's leading cardiac care medical professionals With Basic service, know instantly if your heart rhythm is normal or if atrial fibrillation is detected, and email the last single EKG recording to yourself or your doctor Premium service, available for purchase through the Kardia app for $9.99 per month or $99 per year, includes unlimited history and storage of your EKG recordings, a monthly EKG summary report to share with your doctor, along with the ability to track your blood pressure, activity and weight Includes one KardiaMobile phone clip FREE SHIPPING: Standard delivery 1-3 business days. Orders placed by 11:00am PST will ship that afternoon. Otherwise, will ship next business day. All orders ship via [employer] from [place], [place] - sending an EKG The Peps[employer] and set up profile. Run EKG - by placing 1-2 fingers on the silver plates After EKG is complete - Download PDF  - Skip password (if you apply a password the provider will need it to view the EKG) Click share button (square with upward arrow) in bottom left corner To send: choose MyChart (first time log into MyChart)  Pop up window about sending ECG Click continue Choose  type of message Choose provider Type subject and message Click send (EKG should be attached)  - To send additional EKGs in one message click the paperclip image and bottom of page to attach.

## 2021-05-11 NOTE — Progress Notes (Signed)
Cardiology Office Note:    Date:  05/11/2021   ID:  Mary [name], DOB [birth year], MRN [id number]  PCP:  Unk Pinto, MD  Cardiologist:  Berniece Salines, DO  Electrophysiologist:  None   Referring MD: Magda Bernheim, NP   " I am experiencing chest pain"  History of Present Illness:    Mary [name] is a 56 y.o. female with a hx of hyperlipidemia, depression, anxiety, mitral valve prolapse is here today to be evaluated for chest discomfort as well as shortness of breath.  Patient tells me that she has been experiencing intermittent chest tightness.  She describes it as a intermittent squeezing sensation.  Sometimes she tells me it starts off and on about any exacerbating factors.  It would last for few minutes and then resolved.  She admits to associated shortness of breath.  She notes that has been going on for a while now and she is concerned about this.  Nothing makes it better or worse.  She notes that she is concerned about this because her mother had a myocardial infarction at a premature age.  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   ALPRAZolam (XANAX) 1 MG tablet TAKE 1/2 TO 1 TABLET 1-2 TIMES PER DAY IF NEEDED FOR ANXIETY OR SLEEP   aspirin 81 MG chewable tablet Chew 81 mg  by mouth daily.   buPROPion (WELLBUTRIN XL) 300 MG 24 hr tablet Take 1 tablet Daily for Mood   Cholecalciferol (VITAMIN D PO) Take by mouth.   Cyanocobalamin (B-12 PO) Take by mouth.   Fexofenadine HCl (ALLEGRA PO) Take 180 mg by mouth daily.   metoprolol tartrate (LOPRESSOR) 100 MG tablet Take 2 hours prior to CT     Allergies:   Patient has no known allergies.   Social History   Socioeconomic History   Marital status: Married    Spouse name: Not on file   Number of children: Not on file   Years of education: Not on file   Highest education level: Not on file  Occupational History   Not on file  Tobacco Use   Smoking status: Former   Smokeless tobacco: Never  Vaping Use   Vaping Use: Never used  Substance and Sexual Activity   Alcohol use: Yes    Alcohol/week: 0.0 standard drinks    Comment: occassionally   Drug use: No   Sexual activity: Yes    Birth control/protection: Other-see comments    Comment: Vasectomy-1st intercourse 56 yo-Fewer than 5 partners  Other Topics Concern   Not on file  Social History Narrative   Not on file   Social Determinants of Health   Financial Resource Strain: Not on  file  Food Insecurity: Not on file  Transportation Needs: Not on file  Physical Activity: Not on file  Stress: Not on file  Social Connections: Not on file     Family History: The patient's family history includes Diabetes in her mother; Heart disease in her mother; Hyperlipidemia in her father and mother; Hypertension in her father and mother. There is no history of Colon cancer, Colon polyps, Esophageal cancer, Stomach cancer, Rectal cancer, or Breast cancer.  ROS:   Review of Systems  Constitution: Negative for decreased appetite, fever and weight gain.  HENT: Negative for congestion, ear discharge, hoarse voice and sore throat.   Eyes: Negative for discharge, redness, vision loss in right eye and visual halos.  Cardiovascular: Negative for chest pain, dyspnea on  exertion, leg swelling, orthopnea and palpitations.  Respiratory: Negative for cough, hemoptysis, shortness of breath and snoring.   Endocrine: Negative for heat intolerance and polyphagia.  Hematologic/Lymphatic: Negative for bleeding problem. Does not bruise/bleed easily.  Skin: Negative for flushing, nail changes, rash and suspicious lesions.  Musculoskeletal: Negative for arthritis, joint pain, muscle cramps, myalgias, neck pain and stiffness.  Gastrointestinal: Negative for abdominal pain, bowel incontinence, diarrhea and excessive appetite.  Genitourinary: Negative for decreased libido, genital sores and incomplete emptying.  Neurological: Negative for brief paralysis, focal weakness, headaches and loss of balance.  Psychiatric/Behavioral: Negative for altered mental status, depression and suicidal ideas.  Allergic/Immunologic: Negative for HIV exposure and persistent infections.    EKGs/Labs/Other Studies Reviewed:    The following studies were reviewed today:   EKG:  The ekg ordered today demonstrates sinus rhythm, heart rate 87 bpm with left atrial enlargement and nonspecific ST abnormality   Recent Labs: 04/19/2021: ALT 11; BUN 17; Creat 0.80; Hemoglobin 13.4; Magnesium 2.0; Platelets 262; Potassium 4.9; Sodium 141; TSH 2.34  Recent Lipid Panel    Component Value Date/Time   CHOL 250 (H) 04/19/2021 0945   TRIG 86 04/19/2021 0945   HDL 108 04/19/2021 0945   CHOLHDL 2.3 04/19/2021 0945   VLDL 18 03/21/2016 1045   LDLCALC 123 (H) 04/19/2021 0945    Physical Exam:    VS:  BP 120/80 (BP Location: Left Arm, Patient Position: Sitting)    Pulse 87    Ht 5\' 6"  (1.676 m)    Wt 169 lb 9.6 oz (76.9 kg)    SpO2 99%    BMI 27.37 kg/m     Wt Readings from Last 3 Encounters:  05/11/21 169 lb 9.6 oz (76.9 kg)  04/19/21 168 lb 3.2 oz (76.3 kg)  10/06/20 160 lb (72.6 kg)     GEN: Well nourished, well developed in no acute distress HEENT: Normal NECK: No JVD; No carotid  bruits LYMPHATICS: No lymphadenopathy CARDIAC: S1S2 noted,RRR, no murmurs, rubs, gallops RESPIRATORY:  Clear to auscultation without rales, wheezing or rhonchi  ABDOMEN: Soft, non-tender, non-distended, +bowel sounds, no guarding. EXTREMITIES: No edema, No cyanosis, no clubbing MUSCULOSKELETAL:  No deformity  SKIN: Warm and dry NEUROLOGIC:  Alert and oriented x 3, non-focal PSYCHIATRIC:  Normal affect, good insight  ASSESSMENT:    1. Chest pain, unspecified type   2. Hyperlipidemia, unspecified hyperlipidemia type   3. MVP (mitral valve prolapse)   4. SOB (shortness of breath)    PLAN:    The symptoms chest pain is concerning, this patient does have intermediate risk for coronary artery disease and at this time I would like to pursue an ischemic evaluation in this patient.  Shared decision a coronary CTA  at this time is appropriate.  I have discussed with the patient about the testing.  The patient has no IV contrast allergy and is agreeable to proceed with this test.  Echocardiogram will also be done to assess LV/RV function and any other structure abnormalities especially given changes on the EKG suggesting biatrial enlargement and history of mitral valve prolapse.  Hyperlipidemia-diet modification.  The patient is in agreement with the above plan. The patient left the office in stable condition.  The patient will follow up in the 4 months or sooner if needed.   Medication Adjustments/Labs and Tests Ordered: Current medicines are reviewed at length with the patient today.  Concerns regarding medicines are outlined above.  Orders Placed This Encounter  Procedures   CT CORONARY MORPH W/CTA COR W/SCORE W/CA W/CM &/OR WO/CM   Basic Metabolic Panel (BMET)   Magnesium   EKG 12-Lead   ECHOCARDIOGRAM COMPLETE   Meds ordered this encounter  Medications   metoprolol tartrate (LOPRESSOR) 100 MG tablet    Sig: Take 2 hours prior to CT    Dispense:  1 tablet    Refill:  0     Patient Instructions  Medication Instructions:  Your physician recommends that you continue on your current medications as directed. Please refer to the Current Medication list given to you today.  *If you need a refill on your cardiac medications before your next appointment, please call your pharmacy*   Lab Work: Your physician recommends that you return for lab work in:  TODAY: BMET, [place] If you have labs (blood work) drawn today and your tests are completely normal, you will receive your results only by: MyChart Message (if you have [place]) OR A paper copy in the mail If you have any lab test that is abnormal or we need to change your treatment, we will call you to review the results.   Testing/Procedures: Your physician has requested that you have an echocardiogram. Echocardiography is a painless test that uses sound waves to create images of your heart. It provides your doctor with information about the size and shape of your heart and how well your hearts chambers and valves are working. This procedure takes approximately one hour. There are no restrictions for this procedure.    Your cardiac CT will be scheduled at one of the below locations:   [facility] [address] [place], [place] [address] [phone]   If scheduled at [facility], please arrive at the [facility] main entrance (entrance A) of [facility] 30 minutes prior to test start time. You can use the FREE valet parking offered at the main entrance (encouraged to control the heart rate for the test) Proceed to the [facility] Radiology Department (first floor) to check-in and test prep.  Please follow these instructions carefully (unless otherwise directed):  On the Night Before the Test: Be sure to Drink plenty of water. Do not consume any caffeinated/decaffeinated beverages or chocolate 12 hours prior to your test. Do not take any antihistamines 12 hours prior to your  test.  On the Day of the Test: Drink plenty of water until 1 hour prior to the test. Do not eat any food 4 hours prior to the test. You may take your regular medications prior to the test.  Take metoprolol (Lopressor) two hours prior to test. FEMALES- please wear underwire-free bra if available, avoid dresses & tight clothing      After the Test: Drink plenty of water.  After receiving IV contrast, you may experience a mild flushed feeling. This is normal. On occasion, you may experience a mild rash up to 24 hours after the test. This is not dangerous. If this occurs, you can take Benadryl 25 mg and increase your fluid intake. If you experience trouble breathing, this can be serious. If it is severe call 911 IMMEDIATELY. If it is mild, please call our office. If you take any of these medications: Glipizide/Metformin, Avandament, Glucavance, please do not take 48 hours after completing test unless otherwise instructed.  We will call to schedule your test 2-4 weeks out understanding that some insurance companies will need an authorization prior to the service being performed.   For non-scheduling related questions, please contact the cardiac imaging nurse navigator should you have any questions/concerns: Marchia Bond, Cardiac Imaging Nurse Navigator Gordy Clement, Cardiac Imaging Nurse Navigator [facility] Direct Office Dial: [phone]   For scheduling needs, including cancellations and rescheduling, please call [place], [phone].    Follow-Up: At [facility], you and your health needs are our priority.  As part of our continuing mission to provide you with exceptional heart care, we have created designated Provider Care Teams.  These Care Teams include your primary Cardiologist (physician) and Advanced Practice Providers (APPs -  Physician Assistants and Nurse Practitioners) who all work together to provide you with the care you need, when you need  it.  We recommend signing up for the patient portal called "MyChart".  Sign up information is provided on this After Visit Summary.  MyChart is used to connect with patients for Virtual Visits (Telemedicine).  Patients are able to view lab/test results, encounter notes, upcoming appointments, etc.  Non-urgent messages can be sent to your provider as well.   To learn more about what you can do with MyChart, go to [email or web address].    Your next appointment:   4 month(s)  The format for your next appointment:   In Person  Provider:   Berniece Salines, DO     Other Instructions  KardiaMobile [email or web address]        FDA-cleared, clinical grade mobile EKG monitor: Jodelle Red is the most clinically-validated mobile EKG used by the world's leading cardiac care medical professionals With Basic service, know instantly if your heart rhythm is normal or if atrial fibrillation is detected, and email the last single EKG recording to yourself or your doctor Premium service, available for purchase through the Kardia app for $9.99 per month or $99 per year, includes unlimited history and storage of your EKG recordings, a monthly EKG summary report to share with your doctor, along with the ability to track your blood pressure, activity and weight Includes one KardiaMobile phone clip FREE SHIPPING: Standard delivery 1-3 business days. Orders placed by 11:00am PST will ship that afternoon. Otherwise, will ship next business day. All orders ship via [employer] from [place], [place] - sending an EKG [employer] and set up profile. Run EKG - by placing 1-2 fingers on the silver plates After EKG is complete - Download PDF  - Skip password (if you apply a password the provider will need it to view the EKG) Click share button (square with upward arrow) in bottom left corner To send: choose MyChart (first time log into MyChart)  Pop up window about sending  ECG Click continue Choose type of message Choose provider Type subject and message Click send (EKG should be attached)  - To send  additional EKGs in one message click the paperclip image and bottom of page to attach.      Adopting a Healthy Lifestyle.  Know what a healthy weight is for you (roughly BMI <25) and aim to maintain this   Aim for 7+ servings of fruits and vegetables daily   65-80+ fluid ounces of water or unsweet tea for healthy kidneys   Limit to max 1 drink of alcohol per day; avoid smoking/tobacco   Limit animal fats in diet for cholesterol and heart health - choose grass fed whenever available   Avoid highly processed foods, and foods high in saturated/trans fats   Aim for low stress - take time to unwind and care for your mental health   Aim for 150 min of moderate intensity exercise weekly for heart health, and weights twice weekly for bone health   Aim for 7-9 hours of sleep daily   When it comes to diets, agreement about the perfect plan isnt easy to find, even among the experts. Experts at [employer] developed an idea known as the Healthy Eating Plate. Just imagine a plate divided into logical, healthy portions.   The emphasis is on diet quality:   Load up on vegetables and fruits - one-half of your plate: Aim for color and variety, and remember that potatoes dont count.   Go for whole grains - one-quarter of your plate: Whole wheat, barley, wheat berries, quinoa, oats, brown rice, and foods made with them. If you want pasta, go with whole wheat pasta.   Protein power - one-quarter of your plate: Fish, chicken, beans, and nuts are all healthy, versatile protein sources. Limit red meat.   The diet, however, does go beyond the plate, offering a few other suggestions.   Use healthy plant oils, such as olive, canola, soy, corn, sunflower and peanut. Check the labels, and avoid partially hydrogenated oil, which have unhealthy trans  fats.   If youre thirsty, drink water. Coffee and tea are good in moderation, but skip sugary drinks and limit milk and dairy products to one or two daily servings.   The type of carbohydrate in the diet is more important than the amount. Some sources of carbohydrates, such as vegetables, fruits, whole grains, and beans-are healthier than others.   Finally, stay active  Signed, Berniece Salines, DO  05/11/2021 1:35 PM     [facility]

## 2021-05-12 LAB — MAGNESIUM: Magnesium: 2.3 mg/dL (ref 1.6–2.3)

## 2021-05-12 LAB — BASIC METABOLIC PANEL
BUN/Creatinine Ratio: 26 — ABNORMAL HIGH (ref 9–23)
BUN: 20 mg/dL (ref 6–24)
CO2: 23 mmol/L (ref 20–29)
Calcium: 10 mg/dL (ref 8.7–10.2)
Chloride: 102 mmol/L (ref 96–106)
Creatinine, Ser: 0.76 mg/dL (ref 0.57–1.00)
Glucose: 90 mg/dL (ref 70–99)
Potassium: 5.4 mmol/L — ABNORMAL HIGH (ref 3.5–5.2)
Sodium: 143 mmol/L (ref 134–144)
eGFR: 92 mL/min/{1.73_m2} (ref >59)

## 2021-05-13 ENCOUNTER — Other Ambulatory Visit: Payer: Self-pay

## 2021-05-13 ENCOUNTER — Telehealth: Payer: Self-pay | Admitting: Cardiology

## 2021-05-13 DIAGNOSIS — E875 Hyperkalemia: Secondary | ICD-10-CM

## 2021-05-13 NOTE — Telephone Encounter (Signed)
Spoke with patient who asked about lab orders. Orders received from Dr. Harriet Masson for BMP and magnesium. Orders placed. Per patient request, lab orders given to her in [place].

## 2021-05-13 NOTE — Telephone Encounter (Signed)
° °  Pt said she saw her lab result on mychart and saw that Dr. Harriet Masson wants her to repeat lab work in 2 weeks but no order on file yet

## 2021-05-16 ENCOUNTER — Other Ambulatory Visit: Payer: Self-pay

## 2021-05-16 DIAGNOSIS — Z79899 Other long term (current) drug therapy: Secondary | ICD-10-CM

## 2021-05-16 NOTE — Progress Notes (Signed)
Future lab orders placed, will send orders via mail.

## 2021-05-20 ENCOUNTER — Telehealth (HOSPITAL_COMMUNITY): Payer: Self-pay | Admitting: Emergency Medicine

## 2021-05-20 NOTE — Telephone Encounter (Signed)
Reaching out to patient to offer assistance regarding upcoming cardiac imaging study; pt verbalizes understanding of appt date/time, parking situation and where to check in, pre-test NPO status and medications ordered, and verified current allergies; name and call back number provided for further questions should they arise ?Marchia Bond RN Navigator Cardiac Imaging ?[facility] ?[phone] office ?[phone] cell ? ?Denies iv issues ?100mg  metoprolol tartrate  ?Arrival 930 ? ?

## 2021-05-23 ENCOUNTER — Other Ambulatory Visit: Payer: Self-pay

## 2021-05-23 ENCOUNTER — Ambulatory Visit (HOSPITAL_COMMUNITY)
Admission: RE | Admit: 2021-05-23 | Discharge: 2021-05-23 | Disposition: A | Discharge status: Home / Self Care | Payer: Commercial Managed Care - PPO | Source: Ambulatory Visit | Attending: Cardiology | Admitting: Cardiology

## 2021-05-23 ENCOUNTER — Encounter (HOSPITAL_COMMUNITY): Payer: Self-pay

## 2021-05-23 DIAGNOSIS — R079 Chest pain, unspecified: Secondary | ICD-10-CM | POA: Insufficient documentation

## 2021-05-23 MED ORDER — NITROGLYCERIN 0.4 MG SL SUBL
SUBLINGUAL_TABLET | SUBLINGUAL | Status: AC
  Filled 2021-05-23: qty 2

## 2021-05-23 MED ORDER — NITROGLYCERIN 0.4 MG SL SUBL
0.8000 mg | SUBLINGUAL_TABLET | Freq: Once | SUBLINGUAL | Status: AC
  Administered 2021-05-23: 0.8 mg via SUBLINGUAL

## 2021-05-23 MED ORDER — IOHEXOL 350 MG/ML SOLN
100.0000 mL | Freq: Once | INTRAVENOUS | Status: AC | PRN
  Administered 2021-05-23: 100 mL via INTRAVENOUS

## 2021-05-26 ENCOUNTER — Ambulatory Visit (HOSPITAL_COMMUNITY): Payer: Commercial Managed Care - PPO | Attending: Internal Medicine

## 2021-05-26 ENCOUNTER — Other Ambulatory Visit: Payer: Self-pay

## 2021-05-26 DIAGNOSIS — R0602 Shortness of breath: Secondary | ICD-10-CM | POA: Diagnosis not present

## 2021-05-26 LAB — ECHOCARDIOGRAM COMPLETE
Area-P 1/2: 4.21 cm2
S' Lateral: 3 cm

## 2021-05-27 LAB — BASIC METABOLIC PANEL
BUN/Creatinine Ratio: 26 — ABNORMAL HIGH (ref 9–23)
BUN: 18 mg/dL (ref 6–24)
CO2: 24 mmol/L (ref 20–29)
Calcium: 10 mg/dL (ref 8.7–10.2)
Chloride: 105 mmol/L (ref 96–106)
Creatinine, Ser: 0.69 mg/dL (ref 0.57–1.00)
Glucose: 90 mg/dL (ref 70–99)
Potassium: 4.9 mmol/L (ref 3.5–5.2)
Sodium: 145 mmol/L — ABNORMAL HIGH (ref 134–144)
eGFR: 102 mL/min/{1.73_m2} (ref >59)

## 2021-05-27 LAB — MAGNESIUM: Magnesium: 1.9 mg/dL (ref 1.6–2.3)

## 2021-06-28 ENCOUNTER — Other Ambulatory Visit: Payer: Self-pay | Admitting: Nurse Practitioner

## 2021-06-28 DIAGNOSIS — F411 Generalized anxiety disorder: Secondary | ICD-10-CM

## 2021-06-28 MED ORDER — ALPRAZOLAM 1 MG PO TABS: ORAL_TABLET | ORAL | 0 refills | Status: DC

## 2021-08-10 ENCOUNTER — Ambulatory Visit: Payer: Commercial Managed Care - PPO | Admitting: Nurse Practitioner

## 2021-08-10 VITALS — BP 120/85 | HR 91 | Temp 95.7°F | Wt 166.0 lb

## 2021-08-10 DIAGNOSIS — R3911 Hesitancy of micturition: Secondary | ICD-10-CM | POA: Diagnosis not present

## 2021-08-10 DIAGNOSIS — R350 Frequency of micturition: Secondary | ICD-10-CM

## 2021-08-10 DIAGNOSIS — R300 Dysuria: Secondary | ICD-10-CM | POA: Diagnosis not present

## 2021-08-10 MED ORDER — NITROFURANTOIN MONOHYD MACRO 100 MG PO CAPS: 100.0000 mg | ORAL_CAPSULE | Freq: Two times a day (BID) | ORAL | 0 refills | Status: AC

## 2021-08-10 NOTE — Patient Instructions (Signed)
Asymptomatic Bacteriuria Asymptomatic bacteriuria is the presence of a large number of bacteria in the urine without the usual symptoms of burning or frequent urination. This is usually not harmful, and treatment may not be needed. A person with this condition will not be more likely to develop an infection in the future. What are the causes? This condition is caused by an increase in bacteria in the urine. This increase can be caused by: Bacteria entering the urinary tract, such as during sex. A blockage in the urinary tract, such as from kidney stones or a tumor. Bladder problems that prevent the bladder from emptying. What increases the risk? You are more likely to develop this condition if: You have diabetes. You are an older adult. This especially affects older adults in long-term care facilities. You are pregnant and in the first trimester. You have kidney stones. You are female. You have had a kidney transplant. You have a leaky kidney tube valve (reflux). You had a urinary catheter for a long period of time. This is a long, thin tube that collects urine. What are the signs or symptoms? There are no symptoms of this condition. How is this diagnosed? This condition is diagnosed with a urine test. Because this condition does not cause symptoms, it is usually diagnosed when a urine sample is taken to treat or diagnose another condition, such as pregnancy or kidney problems. Most women who are in their first trimester of pregnancy are screened for asymptomatic bacteriuria. How is this treated? Usually, treatment is not needed for this condition. Treating the condition can lead to other problems, such as a yeast infection or the growth of bacteria that do not respond to treatment (antibiotic-resistant bacteria). Some people do need treatment with antibiotic medicines to prevent kidney infection, known as pyelonephritis. Treatment is needed if: You are pregnant. In pregnant women, kidney  infection can lead to: Early labor (premature labor). Very low birth weight (fetal growth restriction). Newborn death. You are having a procedure that affects the urinary tract. You have had a kidney transplant. If you are diagnosed with this condition, talk with your health care provider about any concerns that you have. Follow these instructions at home: Medicines Take over-the-counter and prescription medicines only as told by your health care provider. If you were prescribed an antibiotic medicine, take it as told by your health care provider. Do not stop using the antibiotic even if you start to feel better. General instructions Monitor your condition for any changes. Drink enough fluid to keep your urine pale yellow. Urinate more often to keep your bladder empty. If you are female, keep the area around your vagina and rectum clean. Wipe from front to back after urinating or having a bowel movement. Use each piece of toilet paper only once. Keep all follow-up visits. This is important. Contact a health care provider if: You have symptoms of a urine infection, such as: A burning sensation, or pain when you urinate. A strong need to urinate, or urinating more often. Urine turning discolored or cloudy. Blood in your urine. Urine that smells bad. Get help right away if: You develop signs of a kidney infection, such as: Back pain or pelvic pain. A fever or chills. Nausea or vomiting. Severe pain that cannot be controlled with medicine. Summary Asymptomatic bacteriuria is the presence of a large number of bacteria in the urine without the usual symptoms of burning or frequent urination. Usually, treatment is not needed for this condition. Treating the condition can lead   to other problems, such as a yeast infection or the growth of bacteria that do not respond to treatment. Some people do need treatment. Treatment is needed if you are pregnant, if you are having a procedure that  affects the urinary tract, or if you have had a kidney transplant. If you were prescribed an antibiotic medicine, take it as told by your health care provider. Do not stop using the antibiotic even if you start to feel better. This information is not intended to replace advice given to you by your health care provider. Make sure you discuss any questions you have with your health care provider. Document Revised: 10/17/2019 Document Reviewed: 10/17/2019 Elsevier Patient Education  2023 [employer]  

## 2021-08-10 NOTE — Progress Notes (Signed)
Assessment and Plan:  Mary [name] was seen today for an episodic visit.  Diagnoses and all order for this visit:  Dysuria/Urinary frequency/Hesitancy of micturition Will treat prophylactically Start Nitrofurantoin. Consider AZO. Continue to stay well hydrated. Continue to monitor for any increase in fever, chills, N/V, uncontrolled back pain.   Notify office is s/s fail to improve.  - Urinalysis w microscopic + reflex cultur - nitrofurantoin, macrocrystal-monohydrate, (MACROBID) 100 MG capsule; Take 1 capsule (100 mg total) by mouth 2 (two) times daily for 5 days.  Dispense: 10 capsule; Refill: 0 - REFLEXIVE URINE CULTURE  The risks and benefits of my recommendations, as well as other treatment options were discussed with the patient today. Questions were answered.  Further disposition pending results of labs. Discussed med's effects and SE's.    Over 15 minutes of exam, counseling, chart review, and critical decision making was performed.   Future Appointments  Date Time Provider [place]  10/07/2021 10:20 AM Berniece Salines, DO [facility]  10/21/2021  9:30 AM Magda Bernheim, NP [facility] None  04/19/2022  9:00 AM Magda Bernheim, NP [facility] None    ------------------------------------------------------------------------------------------------------------------   HPI BP 120/85   Pulse 91   Temp (!) 95.7 F (35.4 C)   Wt 166 lb (75.3 kg)   SpO2 92%   BMI 26.79 kg/m    Mary [name] is a 56 y.o. female who complains of dysuria and frequency for 1 week.  Patient denies any other associated symptoms. Patient denies back pain, fever, and vaginal discharge.  Patient does not have a history of recurrent UTI.  Patient does not have a history of pyelonephritis.    Past Medical History:  Diagnosis Date   Abnormal FSH level 03/20/2012   value 29.9   Allergy    Anxiety    Depression with anxiety    Hyperlipidemia    MVP (mitral valve prolapse)    Simple  endometrial hyperplasia 10/19/2006   focal simple hyperplasia at D&C/Novasure   Vitamin D deficiency      No Known Allergies  Current Outpatient Medications on File Prior to Visit  Medication Sig   albuterol (VENTOLIN HFA) 108 (90 Base) MCG/ACT inhaler Inhale 2 puffs into the lungs every 6 (six) hours as needed for wheezing or shortness of breath.   ALPRAZolam (XANAX) 1 MG tablet TAKE 1/2 TO 1 TABLET 1-2 TIMES PER DAY IF NEEDED FOR ANXIETY OR SLEEP   aspirin 81 MG chewable tablet Chew 81 mg by mouth daily.   buPROPion (WELLBUTRIN XL) 300 MG 24 hr tablet Take 1 tablet Daily for Mood   Cholecalciferol (VITAMIN D PO) Take by mouth.   Cyanocobalamin (B-12 PO) Take by mouth.   Fexofenadine HCl (ALLEGRA PO) Take 180 mg by mouth daily.   metoprolol tartrate (LOPRESSOR) 100 MG tablet Take 2 hours prior to CT   No current facility-administered medications on file prior to visit.    ROS: all negative except what is noted in the HPI.   Physical Exam:  BP 120/85   Pulse 91   Temp (!) 95.7 F (35.4 C)   Wt 166 lb (75.3 kg)   SpO2 92%   BMI 26.79 kg/m   General Appearance: NAD.  Awake, conversant and cooperative. Eyes: PERRLA, EOMs intact.  Sclera white.  Conjunctiva without erythema. Sinuses: No frontal/maxillary tenderness.  No nasal discharge. Nares patent.  ENT/Mouth: Ext aud canals clear.  Bilateral TMs w/DOL and without erythema or bulging. Hearing intact.  Posterior pharynx  without swelling or exudate.  Tonsils without swelling or erythema.  Neck: Supple.  No masses, nodules or thyromegaly. Respiratory: Effort is regular with non-labored breathing. Breath sounds are equal bilaterally without rales, rhonchi, wheezing or stridor.  Cardio: RRR with no MRGs. Brisk peripheral pulses without edema.  Abdomen: Active BS in all four quadrants.  Soft and non-tender without guarding, rebound tenderness, hernias or masses. Lymphatics: Non tender without lymphadenopathy.  Musculoskeletal: Full  ROM, 5/5 strength, normal ambulation.  No clubbing or cyanosis. Skin: Appropriate color for ethnicity. Warm without rashes, lesions, ecchymosis, ulcers.  Neuro: CN II-XII grossly normal. Normal muscle tone without cerebellar symptoms and intact sensation.   Psych: AO X 3,  appropriate mood and affect, insight and judgment.     Darrol Jump, NP 3:49 PM [facility]

## 2021-08-11 LAB — NO CULTURE INDICATED

## 2021-08-11 LAB — URINALYSIS W MICROSCOPIC + REFLEX CULTURE
Bacteria, UA: NONE SEEN /HPF
Bilirubin Urine: NEGATIVE
Glucose, UA: NEGATIVE
Hyaline Cast: NONE SEEN /LPF
Ketones, ur: NEGATIVE
Leukocyte Esterase: NEGATIVE
Nitrites, Initial: NEGATIVE
Protein, ur: NEGATIVE
Specific Gravity, Urine: 1.008 (ref 1.001–1.035)
Squamous Epithelial / HPF: NONE SEEN /HPF (ref <=5)
WBC, UA: NONE SEEN /HPF (ref 0–5)
pH: 6 (ref 5.0–8.0)

## 2021-08-18 ENCOUNTER — Other Ambulatory Visit: Payer: Self-pay | Admitting: Obstetrics and Gynecology

## 2021-08-18 DIAGNOSIS — Z1231 Encounter for screening mammogram for malignant neoplasm of breast: Secondary | ICD-10-CM

## 2021-08-22 NOTE — Progress Notes (Unsigned)
Assessment and Plan:  There are no diagnoses linked to this encounter.    Further disposition pending results of labs. Discussed med's effects and SE's.   Over 30 minutes of exam, counseling, chart review, and critical decision making was performed.   Future Appointments  Date Time Provider [place]  08/24/2021  2:30 PM Magda Bernheim, NP [facility] None  09/26/2021 11:10 AM [facility] MM 2 [facility]  10/07/2021 10:20 AM Tobb, Kardie, DO [facility]  10/21/2021  9:30 AM Magda Bernheim, NP [facility] None  04/19/2022  9:00 AM Magda Bernheim, NP [facility] None    ------------------------------------------------------------------------------------------------------------------   HPI There were no vitals taken for this visit. 56 y.o.female presents for  Past Medical History:  Diagnosis Date   Abnormal FSH level 03/20/2012   value 29.9   Allergy    Anxiety    Depression with anxiety    Hyperlipidemia    MVP (mitral valve prolapse)    Simple endometrial hyperplasia 10/19/2006   focal simple hyperplasia at D&C/Novasure   Vitamin D deficiency      No Known Allergies  Current Outpatient Medications on File Prior to Visit  Medication Sig   albuterol (VENTOLIN HFA) 108 (90 Base) MCG/ACT inhaler Inhale 2 puffs into the lungs every 6 (six) hours as needed for wheezing or shortness of breath.   ALPRAZolam (XANAX) 1 MG tablet TAKE 1/2 TO 1 TABLET 1-2 TIMES PER DAY IF NEEDED FOR ANXIETY OR SLEEP   aspirin 81 MG chewable tablet Chew 81 mg by mouth daily.   buPROPion (WELLBUTRIN XL) 300 MG 24 hr tablet Take 1 tablet Daily for Mood   Cholecalciferol (VITAMIN D PO) Take by mouth.   Cyanocobalamin (B-12 PO) Take by mouth.   Fexofenadine HCl (ALLEGRA PO) Take 180 mg by mouth daily.   metoprolol tartrate (LOPRESSOR) 100 MG tablet Take 2 hours prior to CT   No current facility-administered medications on file prior to visit.    ROS: all negative except above.   Physical  Exam:  There were no vitals taken for this visit.  General Appearance: Well nourished, in no apparent distress. Eyes: PERRLA, EOMs, conjunctiva no swelling or erythema Sinuses: No Frontal/maxillary tenderness ENT/Mouth: Ext aud canals clear, TMs without erythema, bulging. No erythema, swelling, or exudate on post pharynx.  Tonsils not swollen or erythematous. Hearing normal.  Neck: Supple, thyroid normal.  Respiratory: Respiratory effort normal, BS equal bilaterally without rales, rhonchi, wheezing or stridor.  Cardio: RRR with no MRGs. Brisk peripheral pulses without edema.  Abdomen: Soft, + BS.  Non tender, no guarding, rebound, hernias, masses. Lymphatics: Non tender without lymphadenopathy.  Musculoskeletal: Full ROM, 5/5 strength, normal gait.  Skin: Warm, dry without rashes, lesions, ecchymosis.  Neuro: Cranial nerves intact. Normal muscle tone, no cerebellar symptoms. Sensation intact.  Psych: Awake and oriented X 3, normal affect, Insight and Judgment appropriate.     Magda Bernheim, NP 1:30 PM [facility]

## 2021-08-24 ENCOUNTER — Ambulatory Visit: Payer: Commercial Managed Care - PPO | Admitting: Nurse Practitioner

## 2021-08-24 ENCOUNTER — Other Ambulatory Visit: Payer: Self-pay

## 2021-08-24 ENCOUNTER — Encounter: Payer: Self-pay | Admitting: Nurse Practitioner

## 2021-08-24 VITALS — BP 112/68 | HR 105 | Temp 97.7°F | Wt 164.0 lb

## 2021-08-24 DIAGNOSIS — R2231 Localized swelling, mass and lump, right upper limb: Secondary | ICD-10-CM | POA: Diagnosis not present

## 2021-08-24 DIAGNOSIS — D229 Melanocytic nevi, unspecified: Secondary | ICD-10-CM

## 2021-08-29 ENCOUNTER — Ambulatory Visit: Payer: Commercial Managed Care - PPO | Admitting: Cardiology

## 2021-09-06 NOTE — Progress Notes (Unsigned)
Assessment and Plan:  There are no diagnoses linked to this encounter.    Further disposition pending results of labs. Discussed med's effects and SE's.   Over 30 minutes of exam, counseling, chart review, and critical decision making was performed.   Future Appointments  Date Time Provider [place]  09/07/2021  2:30 PM Alycia Rossetti, NP [facility] None  09/26/2021 11:10 AM [facility] MM 2 [facility]  10/07/2021 10:20 AM Tobb, Kardie, DO [facility]  10/21/2021  9:30 AM Alycia Rossetti, NP [facility] None  04/19/2022  9:00 AM Alycia Rossetti, NP [facility] None    ------------------------------------------------------------------------------------------------------------------   HPI There were no vitals taken for this visit. 56 y.o.female presents for  Past Medical History:  Diagnosis Date   Abnormal FSH level 03/20/2012   value 29.9   Allergy    Anxiety    Depression with anxiety    Hyperlipidemia    MVP (mitral valve prolapse)    Simple endometrial hyperplasia 10/19/2006   focal simple hyperplasia at D&C/Novasure   Vitamin D deficiency      No Known Allergies  Current Outpatient Medications on File Prior to Visit  Medication Sig   albuterol (VENTOLIN HFA) 108 (90 Base) MCG/ACT inhaler Inhale 2 puffs into the lungs every 6 (six) hours as needed for wheezing or shortness of breath.   ALPRAZolam (XANAX) 1 MG tablet TAKE 1/2 TO 1 TABLET 1-2 TIMES PER DAY IF NEEDED FOR ANXIETY OR SLEEP   aspirin 81 MG chewable tablet Chew 81 mg by mouth daily.   buPROPion (WELLBUTRIN XL) 300 MG 24 hr tablet Take 1 tablet Daily for Mood   Cholecalciferol (VITAMIN D PO) Take by mouth.   Cyanocobalamin (B-12 PO) Take by mouth.   Fexofenadine HCl (ALLEGRA PO) Take 180 mg by mouth daily.   No current facility-administered medications on file prior to visit.    ROS: all negative except above.   Physical Exam:  There were no vitals taken for this  visit.  General Appearance: Well nourished, in no apparent distress. Eyes: PERRLA, EOMs, conjunctiva no swelling or erythema Sinuses: No Frontal/maxillary tenderness ENT/Mouth: Ext aud canals clear, TMs without erythema, bulging. No erythema, swelling, or exudate on post pharynx.  Tonsils not swollen or erythematous. Hearing normal.  Neck: Supple, thyroid normal.  Respiratory: Respiratory effort normal, BS equal bilaterally without rales, rhonchi, wheezing or stridor.  Cardio: RRR with no MRGs. Brisk peripheral pulses without edema.  Abdomen: Soft, + BS.  Non tender, no guarding, rebound, hernias, masses. Lymphatics: Non tender without lymphadenopathy.  Musculoskeletal: Full ROM, 5/5 strength, normal gait.  Skin: Warm, dry without rashes, lesions, ecchymosis.  Neuro: Cranial nerves intact. Normal muscle tone, no cerebellar symptoms. Sensation intact.  Psych: Awake and oriented X 3, normal affect, Insight and Judgment appropriate.     Alycia Rossetti, NP 10:45 AM [facility]

## 2021-09-07 ENCOUNTER — Encounter: Payer: Self-pay | Admitting: Nurse Practitioner

## 2021-09-07 ENCOUNTER — Ambulatory Visit: Payer: Commercial Managed Care - PPO | Admitting: Nurse Practitioner

## 2021-09-07 VITALS — BP 105/79 | HR 95 | Temp 96.9°F | Wt 166.2 lb

## 2021-09-07 DIAGNOSIS — F325 Major depressive disorder, single episode, in full remission: Secondary | ICD-10-CM | POA: Diagnosis not present

## 2021-09-07 DIAGNOSIS — D229 Melanocytic nevi, unspecified: Secondary | ICD-10-CM

## 2021-09-07 DIAGNOSIS — R2231 Localized swelling, mass and lump, right upper limb: Secondary | ICD-10-CM | POA: Diagnosis not present

## 2021-09-13 ENCOUNTER — Ambulatory Visit
Admission: RE | Admit: 2021-09-13 | Discharge: 2021-09-13 | Disposition: A | Discharge status: Home / Self Care | Payer: Commercial Managed Care - PPO | Source: Ambulatory Visit | Attending: Nurse Practitioner | Admitting: Nurse Practitioner

## 2021-09-13 DIAGNOSIS — R2231 Localized swelling, mass and lump, right upper limb: Secondary | ICD-10-CM

## 2021-09-26 ENCOUNTER — Ambulatory Visit
Admission: RE | Admit: 2021-09-26 | Discharge: 2021-09-26 | Disposition: A | Discharge status: Home / Self Care | Payer: Commercial Managed Care - PPO | Source: Ambulatory Visit | Attending: Obstetrics and Gynecology | Admitting: Obstetrics and Gynecology

## 2021-09-26 ENCOUNTER — Other Ambulatory Visit: Payer: Self-pay | Admitting: Obstetrics and Gynecology

## 2021-09-26 DIAGNOSIS — Z1231 Encounter for screening mammogram for malignant neoplasm of breast: Secondary | ICD-10-CM

## 2021-09-27 ENCOUNTER — Other Ambulatory Visit: Payer: Self-pay | Admitting: Obstetrics and Gynecology

## 2021-09-27 DIAGNOSIS — R928 Other abnormal and inconclusive findings on diagnostic imaging of breast: Secondary | ICD-10-CM

## 2021-09-30 ENCOUNTER — Ambulatory Visit
Admission: RE | Admit: 2021-09-30 | Discharge: 2021-09-30 | Disposition: A | Discharge status: Home / Self Care | Payer: Commercial Managed Care - PPO | Source: Ambulatory Visit | Attending: Obstetrics and Gynecology | Admitting: Obstetrics and Gynecology

## 2021-09-30 DIAGNOSIS — R928 Other abnormal and inconclusive findings on diagnostic imaging of breast: Secondary | ICD-10-CM

## 2021-10-07 ENCOUNTER — Encounter: Payer: Self-pay | Admitting: Cardiology

## 2021-10-07 ENCOUNTER — Ambulatory Visit (INDEPENDENT_AMBULATORY_CARE_PROVIDER_SITE_OTHER): Payer: Commercial Managed Care - PPO | Admitting: Cardiology

## 2021-10-07 VITALS — BP 102/70 | HR 92 | Ht 67.0 in | Wt 164.4 lb

## 2021-10-07 DIAGNOSIS — E785 Hyperlipidemia, unspecified: Secondary | ICD-10-CM | POA: Diagnosis not present

## 2021-10-07 DIAGNOSIS — R0789 Other chest pain: Secondary | ICD-10-CM | POA: Diagnosis not present

## 2021-10-07 DIAGNOSIS — R0602 Shortness of breath: Secondary | ICD-10-CM | POA: Diagnosis not present

## 2021-10-07 NOTE — Progress Notes (Signed)
Cardiology Office Note:    Date:  10/07/2021   ID:  Mary [name], DOB 14-May-1965, MRN [id number]  PCP:  Unk Pinto, MD  Cardiologist:  Berniece Salines, DO  Electrophysiologist:  None   Referring MD: Unk Pinto, MD   " I am doing fine"  History of Present Illness:    Mary [name] is a 56 y.o. female with a hx of hyperlipidemia, depression, anxiety, here today for follow-up visit.  Saw the patient in February 2023 at that time she presented to be evaluated for chest pain.  Given her symptoms as well as history of mitral valve prolapse I recommended she get a coronary CT scan as well as an echocardiogram.  She had echocardiogram which was done and did not show any evidence of mitral valve prolapse.  Today she does not have any symptoms.  Past Medical History:  Diagnosis Date   Abnormal FSH level 03/20/2012   value 29.9   Allergy    Anxiety    Depression with anxiety    Hyperlipidemia    MVP (mitral valve prolapse)    Simple endometrial hyperplasia 10/19/2006   focal simple hyperplasia at D&C/Novasure   Vitamin D deficiency     Past Surgical History:  Procedure Laterality Date   AUGMENTATION MAMMAPLASTY Bilateral 1989   CESAREAN SECTION     DILATION AND CURETTAGE OF UTERUS     ENDOMETRIAL ABLATION  10/2006   NOVASURE (FOCAL SIMPLE HYPERPLASIA AT D&C W NOVASURE)    Current Medications: Current Meds  Medication Sig   ALPRAZolam (XANAX) 1 MG tablet TAKE 1/2 TO 1 TABLET 1-2 TIMES PER DAY IF NEEDED FOR ANXIETY OR SLEEP   aspirin 81 MG chewable tablet Chew 81 mg by mouth daily.   buPROPion (WELLBUTRIN XL) 300 MG 24 hr tablet Take 1 tablet Daily for Mood   Cholecalciferol (VITAMIN D PO) Take by mouth.   Cyanocobalamin (B-12 PO) Take by mouth.   Fexofenadine HCl (ALLEGRA PO) Take 180 mg by mouth daily.     Allergies:   Patient has no known allergies.   Social History   Socioeconomic History   Marital status: Married    Spouse name: Not on file   Number  of children: Not on file   Years of education: Not on file   Highest education level: Not on file  Occupational History   Not on file  Tobacco Use   Smoking status: Former   Smokeless tobacco: Never  Vaping Use   Vaping Use: Never used  Substance and Sexual Activity   Alcohol use: Yes    Alcohol/week: 0.0 standard drinks of alcohol    Comment: occassionally   Drug use: No   Sexual activity: Yes    Birth control/protection: Other-see comments    Comment: Vasectomy-1st intercourse 56 yo-Fewer than 5 partners  Other Topics Concern   Not on file  Social History Narrative   Not on file   Social Determinants of Health   Financial Resource Strain: Not on file  Food Insecurity: Not on file  Transportation Needs: Not on file  Physical Activity: Not on file  Stress: Not on file  Social Connections: Not on file     Family History: The patient's family history includes Diabetes in her mother; Heart disease in her mother; Hyperlipidemia in her father and mother; Hypertension in her father and mother. There is no history of Colon cancer, Colon polyps, Esophageal cancer, Stomach cancer, Rectal cancer, or Breast cancer.  ROS:   Review  of Systems  Constitution: Negative for decreased appetite, fever and weight gain.  HENT: Negative for congestion, ear discharge, hoarse voice and sore throat.   Eyes: Negative for discharge, redness, vision loss in right eye and visual halos.  Cardiovascular: Negative for chest pain, dyspnea on exertion, leg swelling, orthopnea and palpitations.  Respiratory: Negative for cough, hemoptysis, shortness of breath and snoring.   Endocrine: Negative for heat intolerance and polyphagia.  Hematologic/Lymphatic: Negative for bleeding problem. Does not bruise/bleed easily.  Skin: Negative for flushing, nail changes, rash and suspicious lesions.  Musculoskeletal: Negative for arthritis, joint pain, muscle cramps, myalgias, neck pain and stiffness.   Gastrointestinal: Negative for abdominal pain, bowel incontinence, diarrhea and excessive appetite.  Genitourinary: Negative for decreased libido, genital sores and incomplete emptying.  Neurological: Negative for brief paralysis, focal weakness, headaches and loss of balance.  Psychiatric/Behavioral: Negative for altered mental status, depression and suicidal ideas.  Allergic/Immunologic: Negative for HIV exposure and persistent infections.    EKGs/Labs/Other Studies Reviewed:    The following studies were reviewed today:   EKG: None today  Transthoracic echocardiogram May 26, 2021 IMPRESSIONS   1. Left ventricular ejection fraction, by estimation, is 55 to 60%. Left  ventricular ejection fraction by 3D volume is 56 %. The left ventricle has  normal function. The left ventricle has no regional wall motion  abnormalities. Left ventricular diastolic   parameters were normal.   2. Right ventricular systolic function is normal. The right ventricular  size is normal. Tricuspid regurgitation signal is inadequate for assessing  PA pressure.   3. The mitral valve is normal in structure. No evidence of mitral valve  regurgitation. No evidence of mitral stenosis.   4. The aortic valve is normal in structure. Aortic valve regurgitation is  not visualized. No aortic stenosis is present.   5. The inferior vena cava is normal in size with greater than 50%  respiratory variability, suggesting right atrial pressure of 3 mmHg.   FINDINGS   Left Ventricle: Left ventricular ejection fraction, by estimation, is 55  to 60%. Left ventricular ejection fraction by 3D volume is 56 %. The left  ventricle has normal function. The left ventricle has no regional wall  motion abnormalities. The left  ventricular internal cavity size was normal in size. There is no left  ventricular hypertrophy. Left ventricular diastolic parameters were  normal. Normal left ventricular filling pressure.   Right  Ventricle: The right ventricular size is normal. No increase in  right ventricular wall thickness. Right ventricular systolic function is  normal. Tricuspid regurgitation signal is inadequate for assessing PA  pressure.   Left Atrium: Left atrial size was normal in size.   Right Atrium: Right atrial size was normal in size.   Pericardium: There is no evidence of pericardial effusion.   Mitral Valve: The mitral valve is normal in structure. No evidence of  mitral valve regurgitation. No evidence of mitral valve stenosis.   Tricuspid Valve: The tricuspid valve is normal in structure. Tricuspid  valve regurgitation is trivial. No evidence of tricuspid stenosis.   Aortic Valve: The aortic valve is normal in structure. Aortic valve  regurgitation is not visualized. No aortic stenosis is present.   Pulmonic Valve: The pulmonic valve was normal in structure. Pulmonic valve  regurgitation is not visualized. No evidence of pulmonic stenosis.   Aorta: The aortic root is normal in size and structure.   Venous: The inferior vena cava is normal in size with greater than 50%  respiratory  variability, suggesting right atrial pressure of 3 mmHg.   IAS/Shunts: No atrial level shunt detected by color flow Doppler.      Coronary CT scan May 23, 2021 PROTOCOL: A 100 kV prospective scan was triggered in the descending thoracic aorta at 111 HU's. Axial non-contrast 3 mm slices were carried out through the heart. The data set was analyzed on a dedicated work station and scored using the [place]. Gantry rotation speed was 250 msecs and collimation was .6 mm. Beta blockade and 0.8 mg of sl NTG was given. The 3D data set was reconstructed in 5% intervals of the 35-75 % of the R-R cycle. Diastolic phases were analyzed on a dedicated work station using MPR, MIP and VRT modes. The patient received 175m OMNIPAQUE IOHEXOL 350 MG/ML SOLN of contrast.   FINDINGS: Quality: Fair, attenuation  artifact, HR 56   Coronary calcium score: The patient's coronary artery calcium score is 0, which places the patient in the 0 percentile.   Coronary arteries: Normal coronary origins.  Right dominance.   Right Coronary Artery: Dominant.  Normal vessel.   Left Main Coronary Artery: Long vessel without disease. Bifurcates into the LAD and LCx arteries.   Left Anterior Descending Coronary Artery: Reaches the apex anteriorly. No disease. Several small diagonal branches.   Left Circumflex Artery: AV groove vessel without disease.   Aorta: Normal size, 25 mm at the mid ascending aorta (level of the PA bifurcation) measured double oblique. No calcifications. No dissection.   Aortic Valve: Trileaflet. No calcifications.   Other findings:   Normal pulmonary vein drainage into the left atrium.   Normal left atrial appendage without a thrombus.   The pulmonary artery was not completely visualized.   Pectus excavatum   IMPRESSION: 1. No evidence of CAD, CADRADS = 0.   2. Coronary calcium score of 0. This was 0 percentile for age and sex matched control.   3. Normal coronary origin with right dominance.   4. Consider non-coronary causes of chest pain, ?related to pectus excavatum     Electronically Signed   By: KPixie CasinoM.D.   On: 05/23/2021 12:12   Recent Labs: 04/19/2021: ALT 11; Hemoglobin 13.4; Platelets 262; TSH 2.34 05/26/2021: BUN 18; Creatinine, Ser 0.69; Magnesium 1.9; Potassium 4.9; Sodium 145  Recent Lipid Panel    Component Value Date/Time   CHOL 250 (H) 04/19/2021 0945   TRIG 86 04/19/2021 0945   HDL 108 04/19/2021 0945   CHOLHDL 2.3 04/19/2021 0945   VLDL 18 03/21/2016 1045   LDLCALC 123 (H) 04/19/2021 0945    Physical Exam:    VS:  BP 102/70   Pulse 92   Ht '5\' 7"'$  (1.702 m)   Wt 164 lb 6.4 oz (74.6 kg)   SpO2 96%   BMI 25.75 kg/m     Wt Readings from Last 3 Encounters:  10/07/21 164 lb 6.4 oz (74.6 kg)  09/07/21 166 lb 3.2 oz (75.4 kg)   08/24/21 164 lb (74.4 kg)     GEN: Well nourished, well developed in no acute distress HEENT: Normal NECK: No JVD; No carotid bruits LYMPHATICS: No lymphadenopathy CARDIAC: S1S2 noted,RRR, no murmurs, rubs, gallops RESPIRATORY:  Clear to auscultation without rales, wheezing or rhonchi  ABDOMEN: Soft, non-tender, non-distended, +bowel sounds, no guarding. EXTREMITIES: No edema, No cyanosis, no clubbing MUSCULOSKELETAL:  No deformity  SKIN: Warm and dry NEUROLOGIC:  Alert and oriented x 3, non-focal PSYCHIATRIC:  Normal affect, good insight  ASSESSMENT:  1. Atypical chest pain   2. Hyperlipidemia, unspecified hyperlipidemia type   3. SOB (shortness of breath)    PLAN:     She is asymptomatic no chest pain.  We discussed her coronary CTA was 0 calcium and no evidence of coronary artery disease.  She does not have mitral valve prolapse-this was confirmed on her recent echocardiogram.  I discussed with the patient about this.  Hyperlipidemia-she prefers diet.  The patient is in agreement with the above plan. The patient left the office in stable condition.  The patient will follow up as needed.   Medication Adjustments/Labs and Tests Ordered: Current medicines are reviewed at length with the patient today.  Concerns regarding medicines are outlined above.  No orders of the defined types were placed in this encounter.  No orders of the defined types were placed in this encounter.   Patient Instructions  Medication Instructions:  Your physician recommends that you continue on your current medications as directed. Please refer to the Current Medication list given to you today.  *If you need a refill on your cardiac medications before your next appointment, please call your pharmacy*   Lab Work: NONE If you have labs (blood work) drawn today and your tests are completely normal, you will receive your results only by: [place] (if you have MyChart) OR A paper copy  in the mail If you have any lab test that is abnormal or we need to change your treatment, we will call you to review the results.   Testing/Procedures: NONE   Follow-Up: At [facility], you and your health needs are our priority.  As part of our continuing mission to provide you with exceptional heart care, we have created designated Provider Care Teams.  These Care Teams include your primary Cardiologist (physician) and Advanced Practice Providers (APPs -  Physician Assistants and Nurse Practitioners) who all work together to provide you with the care you need, when you need it.  We recommend signing up for the patient portal called "MyChart".  Sign up information is provided on this After Visit Summary.  MyChart is used to connect with patients for Virtual Visits (Telemedicine).  Patients are able to view lab/test results, encounter notes, upcoming appointments, etc.  Non-urgent messages can be sent to your provider as well.   To learn more about what you can do with MyChart, go to [email or web address].    Your next appointment:   As Needed   The format for your next appointment:   In Person  Provider:   Berniece Salines, DO    Adopting a Healthy Lifestyle.  Know what a healthy weight is for you (roughly BMI <25) and aim to maintain this   Aim for 7+ servings of fruits and vegetables daily   65-80+ fluid ounces of water or unsweet tea for healthy kidneys   Limit to max 1 drink of alcohol per day; avoid smoking/tobacco   Limit animal fats in diet for cholesterol and heart health - choose grass fed whenever available   Avoid highly processed foods, and foods high in saturated/trans fats   Aim for low stress - take time to unwind and care for your mental health   Aim for 150 min of moderate intensity exercise weekly for heart health, and weights twice weekly for bone health   Aim for 7-9 hours of sleep daily   When it comes to diets, agreement about the perfect plan isnt  easy to find, even among the experts. Experts at  the [employer] developed an idea known as the Healthy Eating Plate. Just imagine a plate divided into logical, healthy portions.   The emphasis is on diet quality:   Load up on vegetables and fruits - one-half of your plate: Aim for color and variety, and remember that potatoes dont count.   Go for whole grains - one-quarter of your plate: Whole wheat, barley, wheat berries, quinoa, oats, brown rice, and foods made with them. If you want pasta, go with whole wheat pasta.   Protein power - one-quarter of your plate: Fish, chicken, beans, and nuts are all healthy, versatile protein sources. Limit red meat.   The diet, however, does go beyond the plate, offering a few other suggestions.   Use healthy plant oils, such as olive, canola, soy, corn, sunflower and peanut. Check the labels, and avoid partially hydrogenated oil, which have unhealthy trans fats.   If youre thirsty, drink water. Coffee and tea are good in moderation, but skip sugary drinks and limit milk and dairy products to one or two daily servings.   The type of carbohydrate in the diet is more important than the amount. Some sources of carbohydrates, such as vegetables, fruits, whole grains, and beans-are healthier than others.   Finally, stay active  Signed, Berniece Salines, DO  10/07/2021 10:27 AM    [place]

## 2021-10-07 NOTE — Patient Instructions (Signed)
Medication Instructions:  Your physician recommends that you continue on your current medications as directed. Please refer to the Current Medication list given to you today.  *If you need a refill on your cardiac medications before your next appointment, please call your pharmacy*   Lab Work: NONE If you have labs (blood work) drawn today and your tests are completely normal, you will receive your results only by: [place] (if you have MyChart) OR A paper copy in the mail If you have any lab test that is abnormal or we need to change your treatment, we will call you to review the results.   Testing/Procedures: NONE   Follow-Up: At [facility], you and your health needs are our priority.  As part of our continuing mission to provide you with exceptional heart care, we have created designated Provider Care Teams.  These Care Teams include your primary Cardiologist (physician) and Advanced Practice Providers (APPs -  Physician Assistants and Nurse Practitioners) who all work together to provide you with the care you need, when you need it.  We recommend signing up for the patient portal called "MyChart".  Sign up information is provided on this After Visit Summary.  MyChart is used to connect with patients for Virtual Visits (Telemedicine).  Patients are able to view lab/test results, encounter notes, upcoming appointments, etc.  Non-urgent messages can be sent to your provider as well.   To learn more about what you can do with MyChart, go to [email or web address].    Your next appointment:   As Needed   The format for your next appointment:   In Person  Provider:   Berniece Salines, DO

## 2021-10-20 NOTE — Progress Notes (Signed)
FOLLOW UP  Assessment and Plan:   Cholesterol Currently above goal, working on lifestyle Continue low cholesterol diet and exercise.  Discussed low saturated fat, high soluble fiber at length Check lipid panel.   BMI 25 Continue to recommend diet heavy in fruits and veggies and low in animal meats, cheeses, and dairy products, appropriate calorie intake Discuss exercise recommendations routinely Continue to monitor weight at each visit  Vitamin D Def At goal at last visit; continue supplementation to maintain goal of 60-100 Defer Vit D level  Anxiety Well managed by current regimen; declined med change; continue medications Stress management techniques discussed, increase water, good sleep hygiene discussed, increase exercise, and increase veggies.   Hot flashes, menopausal Insomia Begin [place] 45 mg daily- savings card URL given and script sent to local pharmacy    Continue diet and meds as discussed. Further disposition pending results of labs. Discussed med's effects and SE's.   Over 30 minutes of exam, counseling, chart review, and critical decision making was performed.   Future Appointments  Date Time Provider [place]  10/21/2021  9:30 AM Alycia Rossetti, NP [facility] None  04/19/2022  9:00 AM Alycia Rossetti, NP [facility] None    ----------------------------------------------------------------------------------------------------------------------  HPI 56 y.o. female  presents for 3 month follow up on cholesterol, depression/anxiety, weight and vitamin D deficiency.   She has been on wellbutrin 300 mg for many years for depression/anxiety, has xanax 0.5-1 mg PRN, reports uses rarely.  Last refill was Xanax 1 mg #30 on 06/29/21 per PDMP   Reports recently tapered off of HRT per GYN and having hot flashes, night time are particularly bothersome and impeding sleep. She states they continue to be problematic. She had no relief with Gabapentin  BMI is  Body mass index is 25.5 kg/m., she has been working on diet and exercise, has been using rowing machine at home, She is eating more healthy Wt Readings from Last 3 Encounters:  10/21/21 162 lb 12.8 oz (73.8 kg)  10/07/21 164 lb 6.4 oz (74.6 kg)  09/07/21 166 lb 3.2 oz (75.4 kg)   Today their BP is BP: 104/68  BP Readings from Last 3 Encounters:  10/21/21 104/68  10/07/21 102/70  09/07/21 105/79   She does workout. She denies chest pain, shortness of breath, dizziness.    She is not on cholesterol medication and denies myalgias. Her cholesterol is not at goal, does have maternal vascular hx. The cholesterol last visit was:   Lab Results  Component Value Date   CHOL 250 (H) 04/19/2021   HDL 108 04/19/2021   LDLCALC 123 (H) 04/19/2021   TRIG 86 04/19/2021   CHOLHDL 2.3 04/19/2021   Last A1C in the office was:  Lab Results  Component Value Date   HGBA1C 5.3 04/19/2021   Patient is on Vitamin D supplement.   Lab Results  Component Value Date   VD25OH 61 04/19/2021     She is on B12 supplement Lab Results  Component Value Date   HUDJSHFW26 378 04/19/2020      Current Medications:  Current Outpatient Medications on File Prior to Visit  Medication Sig   ALPRAZolam (XANAX) 1 MG tablet TAKE 1/2 TO 1 TABLET 1-2 TIMES PER DAY IF NEEDED FOR ANXIETY OR SLEEP   aspirin 81 MG chewable tablet Chew 81 mg by mouth daily.   buPROPion (WELLBUTRIN XL) 300 MG 24 hr tablet Take 1 tablet Daily for Mood   Cholecalciferol (VITAMIN D PO) Take by mouth.  Cyanocobalamin (B-12 PO) Take by mouth.   Fexofenadine HCl (ALLEGRA PO) Take 180 mg by mouth daily.   No current facility-administered medications on file prior to visit.     Allergies: No Known Allergies   Medical History:  Past Medical History:  Diagnosis Date   Abnormal FSH level 03/20/2012   value 29.9   Allergy    Anxiety    Depression with anxiety    Hyperlipidemia    MVP (mitral valve prolapse)    Simple endometrial  hyperplasia 10/19/2006   focal simple hyperplasia at D&C/Novasure   Vitamin D deficiency    Family history- Reviewed and unchanged Social history- Reviewed and unchanged   Review of Systems:  Review of Systems  Constitutional:  Positive for diaphoresis (since tapering off of HRT). Negative for malaise/fatigue and weight loss.       Hot flashes severe  HENT:  Negative for hearing loss and tinnitus.   Eyes:  Negative for blurred vision and double vision.  Respiratory:  Negative for cough, shortness of breath and wheezing.   Cardiovascular:  Negative for chest pain, palpitations, orthopnea, claudication and leg swelling.  Gastrointestinal:  Negative for abdominal pain, blood in stool, constipation, diarrhea, heartburn, melena, nausea and vomiting.  Genitourinary: Negative.   Musculoskeletal:  Negative for joint pain and myalgias.  Skin:  Negative for rash.  Neurological:  Negative for dizziness, tingling, sensory change, weakness and headaches.  Endo/Heme/Allergies:  Negative for polydipsia.  Psychiatric/Behavioral:  Negative for depression and substance abuse. The patient has insomnia (since hot flashes, typically rare). The patient is not nervous/anxious.   All other systems reviewed and are negative.     Physical Exam: BP 104/68   Pulse 89   Temp 98 F (36.7 C)   Ht '5\' 7"'$  (1.702 m)   Wt 162 lb 12.8 oz (73.8 kg)   BMI 25.50 kg/m  Wt Readings from Last 3 Encounters:  10/21/21 162 lb 12.8 oz (73.8 kg)  10/07/21 164 lb 6.4 oz (74.6 kg)  09/07/21 166 lb 3.2 oz (75.4 kg)   General Appearance: Well nourished, in no apparent distress. Eyes: PERRLA, EOMs, conjunctiva no swelling or erythema Sinuses: No Frontal/maxillary tenderness ENT/Mouth: Ext aud canals clear, TMs without erythema, bulging. No erythema, swelling, or exudate on post pharynx.  Tonsils not swollen or erythematous. Hearing normal.  Neck: Supple, thyroid normal.  Respiratory: Respiratory effort normal, BS equal  bilaterally without rales, rhonchi, wheezing or stridor.  Cardio: RRR with no MRGs. Brisk peripheral pulses without edema.  Abdomen: Soft, + BS.  Non tender, no guarding, rebound, hernias, masses. Lymphatics: Non tender without lymphadenopathy.  Musculoskeletal: Full ROM, 5/5 strength, Normal gait Skin: Warm, dry without rashes, lesions, ecchymosis.  Neuro: Cranial nerves intact. No cerebellar symptoms.  Psych: Awake and oriented X 3, normal affect, Insight and Judgment appropriate.    Alycia Rossetti, NP 9:22 AM [facility]

## 2021-10-21 ENCOUNTER — Ambulatory Visit: Payer: Commercial Managed Care - PPO | Admitting: Nurse Practitioner

## 2021-10-21 ENCOUNTER — Other Ambulatory Visit: Payer: Self-pay | Admitting: Nurse Practitioner

## 2021-10-21 ENCOUNTER — Encounter: Payer: Self-pay | Admitting: Nurse Practitioner

## 2021-10-21 VITALS — BP 104/68 | HR 89 | Temp 98.0°F | Ht 67.0 in | Wt 162.8 lb

## 2021-10-21 DIAGNOSIS — Z79899 Other long term (current) drug therapy: Secondary | ICD-10-CM

## 2021-10-21 DIAGNOSIS — E559 Vitamin D deficiency, unspecified: Secondary | ICD-10-CM

## 2021-10-21 DIAGNOSIS — N951 Menopausal and female climacteric states: Secondary | ICD-10-CM

## 2021-10-21 DIAGNOSIS — F411 Generalized anxiety disorder: Secondary | ICD-10-CM | POA: Diagnosis not present

## 2021-10-21 DIAGNOSIS — E785 Hyperlipidemia, unspecified: Secondary | ICD-10-CM | POA: Diagnosis not present

## 2021-10-21 DIAGNOSIS — E663 Overweight: Secondary | ICD-10-CM

## 2021-10-21 MED ORDER — VEOZAH 45 MG PO TABS: 45.0000 mg | ORAL_TABLET | Freq: Every day | ORAL | 3 refills | Status: DC

## 2021-10-21 NOTE — Patient Instructions (Signed)
[place] [phone]  Follow up in 3 months to recheck liver enzymes

## 2021-10-22 LAB — CBC WITH DIFFERENTIAL/PLATELET
Absolute Monocytes: 266 cells/uL (ref 200–950)
Basophils Absolute: 38 cells/uL (ref 0–200)
Basophils Relative: 1 %
Eosinophils Absolute: 99 cells/uL (ref 15–500)
Eosinophils Relative: 2.6 %
HCT: 38.4 % (ref 35.0–45.0)
Hemoglobin: 13 g/dL (ref 11.7–15.5)
Lymphs Abs: 1201 cells/uL (ref 850–3900)
MCH: 31.7 pg (ref 27.0–33.0)
MCHC: 33.9 g/dL (ref 32.0–36.0)
MCV: 93.7 fL (ref 80.0–100.0)
MPV: 9.9 fL (ref 7.5–12.5)
Monocytes Relative: 7 %
Neutro Abs: 2196 cells/uL (ref 1500–7800)
Neutrophils Relative %: 57.8 %
Platelets: 245 10*3/uL (ref 140–400)
RBC: 4.1 10*6/uL (ref 3.80–5.10)
RDW: 12.3 % (ref 11.0–15.0)
Total Lymphocyte: 31.6 %
WBC: 3.8 10*3/uL (ref 3.8–10.8)

## 2021-10-22 LAB — COMPLETE METABOLIC PANEL WITH GFR
AG Ratio: 1.7 (calc) (ref 1.0–2.5)
ALT: 9 U/L (ref 6–29)
AST: 15 U/L (ref 10–35)
Albumin: 4.5 g/dL (ref 3.6–5.1)
Alkaline phosphatase (APISO): 104 U/L (ref 37–153)
BUN: 17 mg/dL (ref 7–25)
CO2: 29 mmol/L (ref 20–32)
Calcium: 9.7 mg/dL (ref 8.6–10.4)
Chloride: 104 mmol/L (ref 98–110)
Creat: 0.79 mg/dL (ref 0.50–1.03)
Globulin: 2.7 g/dL (calc) (ref 1.9–3.7)
Glucose, Bld: 83 mg/dL (ref 65–99)
Potassium: 4.2 mmol/L (ref 3.5–5.3)
Sodium: 142 mmol/L (ref 135–146)
Total Bilirubin: 0.6 mg/dL (ref 0.2–1.2)
Total Protein: 7.2 g/dL (ref 6.1–8.1)
eGFR: 88 mL/min/{1.73_m2} (ref >=60)

## 2021-10-22 LAB — LIPID PANEL
Cholesterol: 260 mg/dL — ABNORMAL HIGH (ref <200)
HDL: 107 mg/dL (ref >=50)
LDL Cholesterol (Calc): 136 mg/dL (calc) — ABNORMAL HIGH
Non-HDL Cholesterol (Calc): 153 mg/dL (calc) — ABNORMAL HIGH (ref <130)
Total CHOL/HDL Ratio: 2.4 (calc) (ref <5.0)
Triglycerides: 74 mg/dL (ref <150)

## 2021-10-22 LAB — TSH: TSH: 1.45 mIU/L (ref 0.40–4.50)

## 2021-10-24 ENCOUNTER — Other Ambulatory Visit: Payer: Self-pay | Admitting: Nurse Practitioner

## 2021-10-24 ENCOUNTER — Encounter: Payer: Self-pay | Admitting: Nurse Practitioner

## 2021-10-24 DIAGNOSIS — E785 Hyperlipidemia, unspecified: Secondary | ICD-10-CM

## 2021-10-24 MED ORDER — VEOZAH 45 MG PO TABS: 1.0000 | ORAL_TABLET | Freq: Every day | ORAL | 3 refills | Status: DC

## 2021-10-24 MED ORDER — ROSUVASTATIN CALCIUM 5 MG PO TABS: 5.0000 mg | ORAL_TABLET | Freq: Every day | ORAL | 3 refills | Status: DC

## 2021-10-24 NOTE — Telephone Encounter (Signed)
Can you help her?

## 2021-11-24 ENCOUNTER — Other Ambulatory Visit: Payer: Self-pay | Admitting: Nurse Practitioner

## 2021-11-24 DIAGNOSIS — F411 Generalized anxiety disorder: Secondary | ICD-10-CM

## 2021-11-25 MED ORDER — ALPRAZOLAM 1 MG PO TABS: ORAL_TABLET | ORAL | 0 refills | Status: DC

## 2022-01-24 NOTE — Progress Notes (Unsigned)
FOLLOW UP  Assessment and Plan:   Cholesterol Currently above goal, working on lifestyle Continue low cholesterol diet and exercise.  Discussed low saturated fat, high soluble fiber at length Check lipid panel.   BMI 25 Continue to recommend diet heavy in fruits and veggies and low in animal meats, cheeses, and dairy products, appropriate calorie intake Discuss exercise recommendations routinely Continue to monitor weight at each visit  Vitamin D Def At goal at last visit; continue supplementation to maintain goal of 60-100 Defer Vit D level  Anxiety Well managed by current regimen; declined med change; continue medications Stress management techniques discussed, increase water, good sleep hygiene discussed, increase exercise, and increase veggies.   Hot flashes, menopausal Insomia Begin Veozah 45 mg daily- savings card URL given and script sent to local pharmacy    Continue diet and meds as discussed. Further disposition pending results of labs. Discussed med's effects and SE's.   Over 30 minutes of exam, counseling, chart review, and critical decision making was performed.   Future Appointments  Date Time Provider [place]  01/25/2022  9:30 AM Alycia Rossetti, NP [facility] None  04/19/2022  9:00 AM Alycia Rossetti, NP [facility] None    ----------------------------------------------------------------------------------------------------------------------  HPI 56 y.o. female  presents for 3 month follow up on cholesterol, depression/anxiety, weight and vitamin D deficiency.   She has been on wellbutrin 300 mg for many years for depression/anxiety, has xanax 0.5-1 mg PRN, reports uses rarely.  Last refill was Xanax 1 mg #30 on 06/29/21 per PDMP   Reports recently tapered off of HRT per GYN and having hot flashes, night time are particularly bothersome and impeding sleep. She states they continue to be problematic. She had no relief with Gabapentin  BMI is  There is no height or weight on file to calculate BMI., she has been working on diet and exercise, has been using rowing machine at home, She is eating more healthy Wt Readings from Last 3 Encounters:  10/21/21 162 lb 12.8 oz (73.8 kg)  10/07/21 164 lb 6.4 oz (74.6 kg)  09/07/21 166 lb 3.2 oz (75.4 kg)   Today their BP is    BP Readings from Last 3 Encounters:  10/21/21 104/68  10/07/21 102/70  09/07/21 105/79   She does workout. She denies chest pain, shortness of breath, dizziness.    She is not on cholesterol medication and denies myalgias. Her cholesterol is not at goal, does have maternal vascular hx. The cholesterol last visit was:   Lab Results  Component Value Date   CHOL 260 (H) 10/21/2021   HDL 107 10/21/2021   LDLCALC 136 (H) 10/21/2021   TRIG 74 10/21/2021   CHOLHDL 2.4 10/21/2021   Last A1C in the office was:  Lab Results  Component Value Date   HGBA1C 5.3 04/19/2021   Patient is on Vitamin D supplement.   Lab Results  Component Value Date   VD25OH 61 04/19/2021     She is on B12 supplement Lab Results  Component Value Date   [id number] 528 04/19/2020      Current Medications:  Current Outpatient Medications on File Prior to Visit  Medication Sig   ALPRAZolam (XANAX) 1 MG tablet TAKE 1/2 TO 1 TABLET 1-2 TIMES PER DAY IF NEEDED FOR ANXIETY OR SLEEP   aspirin 81 MG chewable tablet Chew 81 mg by mouth daily.   buPROPion (WELLBUTRIN XL) 300 MG 24 hr tablet Take 1 tablet Daily for Mood   Cholecalciferol (VITAMIN D  PO) Take by mouth.   Cyanocobalamin (B-12 PO) Take by mouth.   Fexofenadine HCl (ALLEGRA PO) Take 180 mg by mouth daily.   Fezolinetant (VEOZAH) 45 MG TABS Take 1 tablet by mouth daily.   rosuvastatin (CRESTOR) 5 MG tablet Take 1 tablet (5 mg total) by mouth daily.   No current facility-administered medications on file prior to visit.     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 Negative for malaise/fatigue and weight loss.       Hot flashes severe  HENT:  Negative for hearing loss and tinnitus.   Eyes:  Negative for blurred vision and double vision.  Respiratory:  Negative for cough, shortness of breath and wheezing.   Cardiovascular:  Negative for chest pain, palpitations, orthopnea, claudication and leg swelling.  Gastrointestinal:  Negative for abdominal pain, blood in stool, constipation, diarrhea, heartburn, melena, nausea and vomiting.  Genitourinary: Negative.   Musculoskeletal:  Negative for joint pain and myalgias.  Skin:  Negative for rash.  Neurological:  Negative for dizziness, tingling, sensory change, weakness and headaches.  Endo/Heme/Allergies:  Negative for polydipsia.  Psychiatric/Behavioral:  Negative for depression and substance abuse. The patient has insomnia (since hot flashes, typically rare). The patient is not nervous/anxious.   All other systems reviewed and are negative.     Physical Exam: There were no vitals taken for this visit. Wt Readings from Last 3 Encounters:  10/21/21 162 lb 12.8 oz (73.8 kg)  10/07/21 164 lb 6.4 oz (74.6 kg)  09/07/21 166 lb 3.2 oz (75.4 kg)   General Appearance: Well nourished, in no apparent distress. Eyes: PERRLA, EOMs, conjunctiva no swelling or erythema Sinuses: No Frontal/maxillary tenderness ENT/Mouth: Ext aud canals clear, TMs without erythema, bulging. No erythema, swelling, or exudate on post pharynx.  Tonsils not swollen or erythematous. Hearing normal.   Neck: Supple, thyroid normal.  Respiratory: Respiratory effort normal, BS equal bilaterally without rales, rhonchi, wheezing or stridor.  Cardio: RRR with no MRGs. Brisk peripheral pulses without edema.  Abdomen: Soft, + BS.  Non tender, no guarding, rebound, hernias, masses. Lymphatics: Non tender without lymphadenopathy.  Musculoskeletal: Full ROM, 5/5 strength, Normal gait Skin: Warm, dry without rashes, lesions, ecchymosis.  Neuro: Cranial nerves intact. No cerebellar symptoms.  Psych: Awake and oriented X 3, normal affect, Insight and Judgment appropriate.    Alycia Rossetti, NP 11:10 AM [facility]

## 2022-01-25 ENCOUNTER — Encounter: Payer: Self-pay | Admitting: Nurse Practitioner

## 2022-01-25 ENCOUNTER — Ambulatory Visit: Payer: Commercial Managed Care - PPO | Admitting: Nurse Practitioner

## 2022-01-25 VITALS — BP 100/62 | HR 87 | Temp 97.7°F | Ht 67.0 in | Wt 167.2 lb

## 2022-01-25 DIAGNOSIS — Z23 Encounter for immunization: Secondary | ICD-10-CM

## 2022-01-25 DIAGNOSIS — E785 Hyperlipidemia, unspecified: Secondary | ICD-10-CM | POA: Diagnosis not present

## 2022-01-25 DIAGNOSIS — G4700 Insomnia, unspecified: Secondary | ICD-10-CM

## 2022-01-25 DIAGNOSIS — E663 Overweight: Secondary | ICD-10-CM | POA: Diagnosis not present

## 2022-01-25 DIAGNOSIS — E559 Vitamin D deficiency, unspecified: Secondary | ICD-10-CM | POA: Diagnosis not present

## 2022-01-25 DIAGNOSIS — N951 Menopausal and female climacteric states: Secondary | ICD-10-CM | POA: Diagnosis not present

## 2022-01-25 DIAGNOSIS — Z79899 Other long term (current) drug therapy: Secondary | ICD-10-CM

## 2022-01-25 MED ORDER — TRAZODONE HCL 50 MG PO TABS: ORAL_TABLET | ORAL | 2 refills | Status: DC

## 2022-01-25 NOTE — Patient Instructions (Signed)
If you decide to wean off Buproprion cut in half and take for a week and then take 1/2 every other day for a week and can then stop the medication  Bupropion Tablets (Depression/Mood Disorders) What is this medication? BUPROPION (byoo PROE pee on) treats depression. It increases norepinephrine and dopamine in the brain, hormones that help regulate mood. It belongs to a group of medications called NDRIs. This medicine may be used for other purposes; ask your health care provider or pharmacist if you have questions. COMMON BRAND NAME(S): Wellbutrin What should I tell my care team before I take this medication? They need to know if you have any of these conditions: An eating disorder, such as anorexia or bulimia Bipolar disorder or psychosis Diabetes or high blood sugar, treated with medication Glaucoma Heart disease, previous heart attack, or irregular heart beat Head injury or brain tumor High blood pressure Kidney or liver disease Seizures Suicidal thoughts or a previous suicide attempt Tourette's syndrome Weight loss An unusual or allergic reaction to bupropion, other medications, foods, dyes, or preservatives Pregnant or trying to become pregnant Breast-feeding How should I use this medication? Take this medication by mouth with a glass of water. Follow the directions on the prescription label. You can take it with or without food. If it upsets your stomach, take it with food. Take your medication at regular intervals. Do not take your medication more often than directed. Do not stop taking this medication suddenly except upon the advice of your care team. Stopping this medication too quickly may cause serious side effects or your condition may worsen. A special MedGuide will be given to you by the pharmacist with each prescription and refill. Be sure to read this information carefully each time. Talk to your care team regarding the use of this medication in children. Special care may be  needed. Overdosage: If you think you have taken too much of this medicine contact a poison control center or emergency room at once. NOTE: This medicine is only for you. Do not share this medicine with others. What if I miss a dose? If you miss a dose, take it as soon as you can. If it is less than four hours to your next dose, take only that dose and skip the missed dose. Do not take double or extra doses. What may interact with this medication? Do not take this medication with any of the following: Linezolid MAOIs like Azilect, Carbex, Eldepryl, Marplan, Nardil, and Parnate Methylene blue (injected into a vein) Other medications that contain bupropion like Zyban This medication may also interact with the following: Alcohol Certain medications for anxiety or sleep Certain medications for blood pressure like metoprolol, propranolol Certain medications for depression or psychotic disturbances Certain medications for HIV or AIDS like efavirenz, lopinavir, nelfinavir, ritonavir Certain medications for irregular heart beat like propafenone, flecainide Certain medications for Parkinson's disease like amantadine, levodopa Certain medications for seizures like carbamazepine, phenytoin, phenobarbital Cimetidine Clopidogrel Cyclophosphamide Digoxin Furazolidone Isoniazid Nicotine Orphenadrine Procarbazine Steroid medications like prednisone or cortisone Stimulant medications for attention disorders, weight loss, or to stay awake Tamoxifen Theophylline Thiotepa Ticlopidine Tramadol Warfarin This list may not describe all possible interactions. Give your health care provider a list of all the medicines, herbs, non-prescription drugs, or dietary supplements you use. Also tell them if you smoke, drink alcohol, or use illegal drugs. Some items may interact with your medicine. What should I watch for while using this medication? Tell your care team if your symptoms do not  get better or if  they get worse. Visit your care team for regular checks on your progress. Because it may take several weeks to see the full effects of this medication, it is important to continue your treatment as prescribed. Watch for new or worsening thoughts of suicide or depression. This includes sudden changes in mood, behavior, or thoughts. These changes can happen at any time but are more common in the beginning of treatment or after a change in dose. Call your care team right away if you experience these thoughts or worsening depression. Manic episodes may happen in patients with bipolar disorder who take this medication. Watch for changes in feelings or behaviors such as feeling anxious, nervous, agitated, panicky, irritable, hostile, aggressive, impulsive, severely restless, overly excited and hyperactive, or trouble sleeping. These symptoms can happen at anytime but are more common in the beginning of treatment or after a change in dose. Call your care team right away if you notice any of these symptoms. This medication may cause serious skin reactions. They can happen weeks to months after starting the medication. Contact your care team right away if you notice fevers or flu-like symptoms with a rash. The rash may be red or purple and then turn into blisters or peeling of the skin. Or, you might notice a red rash with swelling of the face, lips or lymph nodes in your neck or under your arms. Avoid drinks that contain alcohol while taking this medication. Drinking large amounts of alcohol, using sleeping or anxiety medications, or quickly stopping the use of these agents while taking this medication may increase your risk for a seizure. Do not drive or use heavy machinery until you know how this medication affects you. This medication can impair your ability to perform these tasks. Do not take this medication close to bedtime. It may prevent you from sleeping. Your mouth may get dry. Chewing sugarless gum or  sucking hard candy, and drinking plenty of water may help. Contact your care team if the problem does not go away or is severe. What side effects may I notice from receiving this medication? Side effects that you should report to your care team as soon as possible: Allergic reactions--skin rash, itching, hives, swelling of the face, lips, tongue, or throat Increase in blood pressure Mood and behavior changes--anxiety, nervousness, confusion, hallucinations, irritability, hostility, thoughts of suicide or self-harm, worsening mood, feelings of depression Redness, blistering, peeling, or loosening of the skin, including inside the mouth Seizures Sudden eye pain or change in vision such as blurry vision, seeing halos around lights, vision loss Side effects that usually do not require medical attention (report to your care team if they continue or are bothersome): Constipation Dizziness Dry mouth Loss of appetite Nausea Tremors or shaking Trouble sleeping This list may not describe all possible side effects. Call your doctor for medical advice about side effects. You may report side effects to FDA at 1-800-FDA-1088. Where should I keep my medication? Keep out of the reach of children and pets. Store at room temperature between 20 and 25 degrees C (68 and 77 degrees F), away from direct sunlight and moisture. Keep tightly closed. Throw away any unused medication after the expiration date. NOTE: This sheet is a summary. It may not cover all possible information. If you have questions about this medicine, talk to your doctor, pharmacist, or health care provider.  2023 Elsevier/Gold Standard (2020-03-08 00:00:00)

## 2022-01-26 LAB — LIPID PANEL
Cholesterol: 193 mg/dL (ref <200)
HDL: 106 mg/dL (ref >=50)
LDL Cholesterol (Calc): 70 mg/dL (calc)
Non-HDL Cholesterol (Calc): 87 mg/dL (calc) (ref <130)
Total CHOL/HDL Ratio: 1.8 (calc) (ref <5.0)
Triglycerides: 85 mg/dL (ref <150)

## 2022-01-26 LAB — COMPLETE METABOLIC PANEL WITH GFR
AG Ratio: 1.8 (calc) (ref 1.0–2.5)
ALT: 11 U/L (ref 6–29)
AST: 15 U/L (ref 10–35)
Albumin: 4.5 g/dL (ref 3.6–5.1)
Alkaline phosphatase (APISO): 105 U/L (ref 37–153)
BUN: 18 mg/dL (ref 7–25)
CO2: 28 mmol/L (ref 20–32)
Calcium: 9.5 mg/dL (ref 8.6–10.4)
Chloride: 105 mmol/L (ref 98–110)
Creat: 0.71 mg/dL (ref 0.50–1.03)
Globulin: 2.5 g/dL (calc) (ref 1.9–3.7)
Glucose, Bld: 87 mg/dL (ref 65–99)
Potassium: 4 mmol/L (ref 3.5–5.3)
Sodium: 141 mmol/L (ref 135–146)
Total Bilirubin: 0.5 mg/dL (ref 0.2–1.2)
Total Protein: 7 g/dL (ref 6.1–8.1)
eGFR: 100 mL/min/{1.73_m2} (ref >=60)

## 2022-01-26 LAB — CBC WITH DIFFERENTIAL/PLATELET
Absolute Monocytes: 308 cells/uL (ref 200–950)
Basophils Absolute: 20 cells/uL (ref 0–200)
Basophils Relative: 0.5 %
Eosinophils Absolute: 100 cells/uL (ref 15–500)
Eosinophils Relative: 2.5 %
HCT: 38.4 % (ref 35.0–45.0)
Hemoglobin: 13.1 g/dL (ref 11.7–15.5)
Lymphs Abs: 1292 cells/uL (ref 850–3900)
MCH: 32 pg (ref 27.0–33.0)
MCHC: 34.1 g/dL (ref 32.0–36.0)
MCV: 93.7 fL (ref 80.0–100.0)
MPV: 9.8 fL (ref 7.5–12.5)
Monocytes Relative: 7.7 %
Neutro Abs: 2280 cells/uL (ref 1500–7800)
Neutrophils Relative %: 57 %
Platelets: 243 10*3/uL (ref 140–400)
RBC: 4.1 10*6/uL (ref 3.80–5.10)
RDW: 11.5 % (ref 11.0–15.0)
Total Lymphocyte: 32.3 %
WBC: 4 10*3/uL (ref 3.8–10.8)

## 2022-01-26 LAB — LUTEINIZING HORMONE: LH: 70 m[IU]/mL

## 2022-01-26 LAB — FOLLICLE STIMULATING HORMONE: FSH: 158.7 m[IU]/mL — ABNORMAL HIGH

## 2022-01-26 LAB — MAGNESIUM: Magnesium: 2 mg/dL (ref 1.5–2.5)

## 2022-02-03 ENCOUNTER — Other Ambulatory Visit: Payer: Self-pay

## 2022-02-03 DIAGNOSIS — N951 Menopausal and female climacteric states: Secondary | ICD-10-CM

## 2022-02-03 MED ORDER — VEOZAH 45 MG PO TABS: 1.0000 | ORAL_TABLET | Freq: Every day | ORAL | 3 refills | Status: DC

## 2022-02-09 ENCOUNTER — Other Ambulatory Visit: Payer: Self-pay | Admitting: Nurse Practitioner

## 2022-02-09 DIAGNOSIS — F411 Generalized anxiety disorder: Secondary | ICD-10-CM

## 2022-04-19 ENCOUNTER — Encounter: Payer: Commercial Managed Care - PPO | Admitting: Nurse Practitioner

## 2022-05-09 ENCOUNTER — Other Ambulatory Visit: Payer: Self-pay | Admitting: Nurse Practitioner

## 2022-05-09 DIAGNOSIS — F325 Major depressive disorder, single episode, in full remission: Secondary | ICD-10-CM

## 2022-05-10 ENCOUNTER — Other Ambulatory Visit: Payer: Self-pay | Admitting: Nurse Practitioner

## 2022-05-10 ENCOUNTER — Other Ambulatory Visit: Payer: Self-pay

## 2022-05-10 DIAGNOSIS — F411 Generalized anxiety disorder: Secondary | ICD-10-CM

## 2022-05-10 DIAGNOSIS — N951 Menopausal and female climacteric states: Secondary | ICD-10-CM

## 2022-05-10 MED ORDER — VEOZAH 45 MG PO TABS: 1.0000 | ORAL_TABLET | Freq: Every day | ORAL | 3 refills | Status: DC

## 2022-05-15 ENCOUNTER — Ambulatory Visit: Payer: Commercial Managed Care - PPO | Admitting: Nurse Practitioner

## 2022-05-15 ENCOUNTER — Encounter: Payer: Self-pay | Admitting: Nurse Practitioner

## 2022-05-15 VITALS — BP 102/60 | HR 72 | Temp 97.5°F | Ht 67.0 in | Wt 160.8 lb

## 2022-05-15 DIAGNOSIS — I10 Essential (primary) hypertension: Secondary | ICD-10-CM

## 2022-05-15 DIAGNOSIS — I700 Atherosclerosis of aorta: Secondary | ICD-10-CM

## 2022-05-15 DIAGNOSIS — E785 Hyperlipidemia, unspecified: Secondary | ICD-10-CM

## 2022-05-15 DIAGNOSIS — F411 Generalized anxiety disorder: Secondary | ICD-10-CM

## 2022-05-15 DIAGNOSIS — Z1329 Encounter for screening for other suspected endocrine disorder: Secondary | ICD-10-CM

## 2022-05-15 DIAGNOSIS — Z0000 Encounter for general adult medical examination without abnormal findings: Secondary | ICD-10-CM | POA: Diagnosis not present

## 2022-05-15 DIAGNOSIS — Z0001 Encounter for general adult medical examination with abnormal findings: Secondary | ICD-10-CM

## 2022-05-15 DIAGNOSIS — F325 Major depressive disorder, single episode, in full remission: Secondary | ICD-10-CM

## 2022-05-15 DIAGNOSIS — E663 Overweight: Secondary | ICD-10-CM

## 2022-05-15 DIAGNOSIS — Z79899 Other long term (current) drug therapy: Secondary | ICD-10-CM

## 2022-05-15 DIAGNOSIS — Z136 Encounter for screening for cardiovascular disorders: Secondary | ICD-10-CM | POA: Diagnosis not present

## 2022-05-15 DIAGNOSIS — E559 Vitamin D deficiency, unspecified: Secondary | ICD-10-CM

## 2022-05-15 DIAGNOSIS — Z1389 Encounter for screening for other disorder: Secondary | ICD-10-CM

## 2022-05-15 DIAGNOSIS — Z131 Encounter for screening for diabetes mellitus: Secondary | ICD-10-CM

## 2022-05-15 NOTE — Progress Notes (Signed)
Complete Physical   Assessment and Plan:  Mary [name] was seen today for annual exam.  Diagnoses and all orders for this visit:  Encounter for general adult medical examination with abnormal findings Due Yearly Mammogram and Colonoscopy are UTD  Hyperlipidemia, unspecified hyperlipidemia type Continue diet and exercise -     CBC with Differential/Platelet -     COMPLETE METABOLIC PANEL WITH GFR -     Lipid panel  Vitamin D deficiency Continue Vit D supplementation to maintain value in therapeutic level of 60-100  -     VITAMIN D 25 Hydroxy (Vit-D Deficiency, Fractures)  Anxiety state Continue behavior modifications, relaxation techniques, diet and exercise -     ALPRAZolam (XANAX) 1 MG tablet; TAKE 1/2 TO 1 TABLET 1-2 TIMES PER DAY IF NEEDED FOR ANXIETY OR SLEEP  Depression, major, in remission ([place]) Continue medication, diet ,exercise, relaxation techniques -     buPROPion (WELLBUTRIN XL) 300 MG 24 hr tablet; Take 1 tablet Daily for Mood  Hot flashes due to menopause Pt to try Estroven Off Estrogen patch Continue diet and exercise  Screening, iron deficiency anemia -CBC  Screening for blood or protein in urine -     Urinalysis, Routine w reflex microscopic -     Microalbumin / creatinine urine ratio  Screening for diabetes mellitus -     Hemoglobin A1c  Screening, ischemic heart disease/Palpitations Monitor symptoms Go to the ER if any chest pain, shortness of breath, nausea, dizziness, severe HA, changes vision/speech  -     EKG 12-Lead  Screening for thyroid disorder -     TSH  Medication management -     CBC with Differential/Platelet -     COMPLETE METABOLIC PANEL WITH GFR -     Magnesium -     Lipid panel -     TSH -     Hemoglobin A1c -     VITAMIN D 25 Hydroxy (Vit-D Deficiency, Fractures) -     EKG 12-Lead -     Urinalysis, Routine w reflex microscopic -     Microalbumin / creatinine urine ratio  Screening for AAA - U/S ABD Retroperitoneal  LTD  Dyspnea on exertion -     albuterol (VENTOLIN HFA) 108 (90 Base) MCG/ACT inhaler; Inhale 2 puffs into the lungs every 6 (six) hours as needed for wheezing or shortness of breath.     Discussed med's effects and SE's. Screening labs and tests as requested with regular follow-up as recommended. Future Appointments  Date Time Provider [place]  05/17/2023 10:00 AM Alycia Rossetti, NP [facility] None    HPI  57 y.o. female  presents for a complete physical.  She has tried Trazodone but is not working. She is taking Xanax 1 mg 1/2 tab as needed for sleep and it works well. Tried without but could not sleep.   Daughter is taking the bar this week.  Will know if she passed in 3 weeks.   Pt is having her breast implants replaced in April. Will send test results( CBC, CMP and EKG) from today to Dr. Margorie John   Her blood pressure has been controlled at home, today their BP is BP: 102/60 BP Readings from Last 3 Encounters:  05/15/22 102/60  01/25/22 100/62  10/21/21 104/68   She does not workout, she has stopped going to the gym. She denies chest pain, shortness of breath, dizziness.    She is not on cholesterol medication, mom passed from MI at 103 and  denies myalgias. Her cholesterol is at goal. The cholesterol last visit was:  Lab Results  Component Value Date   CHOL 193 01/25/2022   HDL 106 01/25/2022   LDLCALC 70 01/25/2022   TRIG 85 01/25/2022   CHOLHDL 1.8 01/25/2022   Lab Results  Component Value Date   HGBA1C 5.3 04/19/2021  Discussed drinking glass of water for every pepsi.  Patient is on Vitamin D supplement, 5000 IU daily.  Lab Results  Component Value Date   VD25OH 61 04/19/2021   B12 , is on B12 sublingual Lab Results  Component Value Date   [id number] 04/19/2020   She is on Wellbutrin 300 XL daily, she states it is helping her for depression.   She is going through menopause hot flashes are currently well controlled with Veozah.   BMI is  Body mass index is 25.18 kg/m., she is working on diet and exercise. She has increased her exercise and has started to not eat after 4 PM.  Wt Readings from Last 3 Encounters:  05/15/22 160 lb 12.8 oz (72.9 kg)  01/25/22 167 lb 3.2 oz (75.8 kg)  10/21/21 162 lb 12.8 oz (73.8 kg)   Current Medications:  Current Outpatient Medications on File Prior to Visit  Medication Sig Dispense Refill   ALPRAZolam (XANAX) 1 MG tablet TAKE ONE-HALF (1/2) TO ONE TABLET 1 TO 2 TIMES DAILY IF NEEDED FOR ANXIETY AND SLEEP 30 tablet 0   buPROPion (WELLBUTRIN XL) 300 MG 24 hr tablet TAKE 1 TABLET DAILY FOR MOOD 90 tablet 3   Cholecalciferol (VITAMIN D PO) Take by mouth.     Cyanocobalamin (B-12 PO) Take by mouth.     Fexofenadine HCl (ALLEGRA PO) Take 180 mg by mouth daily.     Fezolinetant (VEOZAH) 45 MG TABS Take 1 tablet (45 mg total) by mouth daily. 30 tablet 3   rosuvastatin (CRESTOR) 5 MG tablet Take 1 tablet (5 mg total) by mouth daily. 90 tablet 3   aspirin 81 MG chewable tablet Chew 81 mg by mouth daily. (Patient not taking: Reported on 05/15/2022)     traZODone (DESYREL) 50 MG tablet 1/2-1 tablet for sleep (Patient not taking: Reported on 05/15/2022) 90 tablet 2   No current facility-administered medications on file prior to visit.   Health Maintenance:   Immunization History  Administered Date(s) Administered   Influenza Split 02/02/2014   Influenza,inj,Quad PF,6+ Mos 01/25/2022   Influenza-Unspecified 01/16/2019, 03/21/2019, 12/28/2020   Moderna Sars-Covid-2 Vaccination 05/29/2019, 12/12/2019, 02/03/2020   Pfizer Covid-19 Vaccine Bivalent Booster 64yr & up 12/28/2020   Tdap 04/02/2018   Zoster Recombinat (Shingrix) 02/01/2018, 05/03/2018   Tetanus: 2020 Pneumovax: 2000 Prevnar 13: NA Flu vaccine: 12/2020 Shingrix: completed 2019  Health Maintenance  Topic Date Due   Hepatitis C Screening  Never done   PAP SMEAR-Modifier  08/19/2019   COVID-19 Vaccine (5 - 2023-24 season) 11/18/2021    MAMMOGRAM  10/01/2023   COLONOSCOPY (Pts 45-455yrInsurance coverage will need to be confirmed)  10/07/2027   DTaP/Tdap/Td (2 - Td or Tdap) 04/02/2028   INFLUENZA VACCINE  Completed   HIV Screening  Completed   Zoster Vaccines- Shingrix  Completed   HPV VACCINES  Aged Out     Patient Care Team: McUnk PintoMD as PCP - General (Internal Medicine) ToBerniece SalinesDO as PCP - Cardiology (Cardiology)  Medical History:  Past Medical History:  Diagnosis Date   Abnormal FSH level 03/20/2012   value 29.9   Allergy  Anxiety    Depression with anxiety    Hyperlipidemia    MVP (mitral valve prolapse)    Simple 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 FAMILY HISTORY Her family history includes Diabetes in her mother; Heart disease in her mother; Hyperlipidemia in her father and mother; Hypertension in her father and mother. SOCIAL HISTORY She  reports that she has quit smoking. She has never used smokeless tobacco. She reports current alcohol use. She reports that she does not use drugs.  Married, has 91 year old daughter. Graduated from [employer], [place] [employer]- going to law school at [employer].   Review of Systems: Review of Systems  Constitutional: Negative.  Negative for chills and fever.       Hot flashes controlled with veozah  HENT: Negative.  Negative for congestion, hearing loss, sinus pain, sore throat and tinnitus.   Eyes: Negative.  Negative for blurred vision and double vision.  Respiratory:  Negative for cough, hemoptysis, sputum production, shortness of breath and wheezing.   Cardiovascular:  Negative for chest pain, palpitations and leg swelling.  Gastrointestinal: Negative.   Negative for abdominal pain, constipation, diarrhea, heartburn, nausea and vomiting.  Genitourinary: Negative.  Negative for dysuria and urgency.  Musculoskeletal: Negative.  Negative for back pain, falls, joint pain, myalgias and neck pain.  Skin: Negative.  Negative for rash.  Neurological: Negative.  Negative for dizziness, tingling, tremors, weakness and headaches.  Endo/Heme/Allergies: Negative.  Does not bruise/bleed easily.  Psychiatric/Behavioral: Negative.  Negative for depression and suicidal ideas. The patient is not nervous/anxious and does not have insomnia.    Physical Exam: Estimated body mass index is 25.18 kg/m as calculated from the following:   Height as of this encounter: '5\' 7"'$  (1.702 m).   Weight as of this encounter: 160 lb 12.8 oz (72.9 kg). BP 102/60   Pulse 72   Temp (!) 97.5 F (36.4 C)   Ht '5\' 7"'$  (1.702 m)   Wt 160 lb 12.8 oz (72.9 kg)   SpO2 98%   BMI 25.18 kg/m  General Appearance: Well nourished, in no apparent distress.  Eyes: PERRLA, EOMs, conjunctiva no swelling or erythema, normal fundi and vessels.  Sinuses: No Frontal/maxillary tenderness  ENT/Mouth: Ext aud canals clear, normal light reflex with TMs without erythema, bulging. Good dentition. No erythema, swelling, or exudate on post pharynx. Tonsils not swollen or erythematous. Hearing normal.  Neck: Supple, thyroid normal. No bruits  Respiratory: Respiratory effort normal, BS equal bilaterally without rales, rhonchi, wheezing or stridor.  Cardio: RRR without murmurs, rubs or gallops. Brisk peripheral pulses without edema.  Chest: symmetric, with normal excursions and percussion.  Breasts: defer to GYN  Abdomen: Soft, nontender, no guarding, rebound, hernias, masses, or organomegaly. Lymphatics: Non tender without lymphadenopathy.  Genitourinary: defer to GYN Musculoskeletal: Full ROM all peripheral extremities,5/5 strength, and normal gait.  Skin: Warm, dry without rashes, lesions,  ecchymosis. Neuro: Cranial nerves intact, reflexes equal bilaterally. Normal muscle tone, no cerebellar symptoms. Sensation intact.  Psych: Awake and oriented X 3, normal affect, Insight and Judgment appropriate.   EKG:  NSR, no ST changes AAA: < 3 cm   Bayard Males, DNP [facility] 05/15/2022  10:05 AM

## 2022-05-15 NOTE — Patient Instructions (Signed)

## 2022-05-16 LAB — URINALYSIS, ROUTINE W REFLEX MICROSCOPIC
Bilirubin Urine: NEGATIVE
Glucose, UA: NEGATIVE
Hgb urine dipstick: NEGATIVE
Ketones, ur: NEGATIVE
Leukocytes,Ua: NEGATIVE
Nitrite: NEGATIVE
Protein, ur: NEGATIVE
Specific Gravity, Urine: 1.014 (ref 1.001–1.035)
pH: 7 (ref 5.0–8.0)

## 2022-05-16 LAB — COMPLETE METABOLIC PANEL WITH GFR
AG Ratio: 1.9 (calc) (ref 1.0–2.5)
ALT: 10 U/L (ref 6–29)
AST: 15 U/L (ref 10–35)
Albumin: 4.5 g/dL (ref 3.6–5.1)
Alkaline phosphatase (APISO): 94 U/L (ref 37–153)
BUN: 15 mg/dL (ref 7–25)
CO2: 28 mmol/L (ref 20–32)
Calcium: 9.7 mg/dL (ref 8.6–10.4)
Chloride: 107 mmol/L (ref 98–110)
Creat: 0.67 mg/dL (ref 0.50–1.03)
Globulin: 2.4 g/dL (calc) (ref 1.9–3.7)
Glucose, Bld: 80 mg/dL (ref 65–99)
Potassium: 4.7 mmol/L (ref 3.5–5.3)
Sodium: 143 mmol/L (ref 135–146)
Total Bilirubin: 0.4 mg/dL (ref 0.2–1.2)
Total Protein: 6.9 g/dL (ref 6.1–8.1)
eGFR: 102 mL/min/{1.73_m2} (ref >=60)

## 2022-05-16 LAB — CBC WITH DIFFERENTIAL/PLATELET
Absolute Monocytes: 252 cells/uL (ref 200–950)
Basophils Absolute: 30 cells/uL (ref 0–200)
Basophils Relative: 0.8 %
Eosinophils Absolute: 100 cells/uL (ref 15–500)
Eosinophils Relative: 2.7 %
HCT: 37.9 % (ref 35.0–45.0)
Hemoglobin: 12.9 g/dL (ref 11.7–15.5)
Lymphs Abs: 1299 cells/uL (ref 850–3900)
MCH: 31.1 pg (ref 27.0–33.0)
MCHC: 34 g/dL (ref 32.0–36.0)
MCV: 91.3 fL (ref 80.0–100.0)
MPV: 10.3 fL (ref 7.5–12.5)
Monocytes Relative: 6.8 %
Neutro Abs: 2020 cells/uL (ref 1500–7800)
Neutrophils Relative %: 54.6 %
Platelets: 231 10*3/uL (ref 140–400)
RBC: 4.15 10*6/uL (ref 3.80–5.10)
RDW: 11.6 % (ref 11.0–15.0)
Total Lymphocyte: 35.1 %
WBC: 3.7 10*3/uL — ABNORMAL LOW (ref 3.8–10.8)

## 2022-05-16 LAB — LIPID PANEL
Cholesterol: 183 mg/dL (ref <200)
HDL: 103 mg/dL (ref >=50)
LDL Cholesterol (Calc): 66 mg/dL (calc)
Non-HDL Cholesterol (Calc): 80 mg/dL (calc) (ref <130)
Total CHOL/HDL Ratio: 1.8 (calc) (ref <5.0)
Triglycerides: 60 mg/dL (ref <150)

## 2022-05-16 LAB — HEMOGLOBIN A1C
Hgb A1c MFr Bld: 5.5 % of total Hgb (ref <5.7)
Mean Plasma Glucose: 111 mg/dL
eAG (mmol/L): 6.2 mmol/L

## 2022-05-16 LAB — TSH: TSH: 1.95 mIU/L (ref 0.40–4.50)

## 2022-05-16 LAB — MICROALBUMIN / CREATININE URINE RATIO
Creatinine, Urine: 68 mg/dL (ref 20–275)
Microalb Creat Ratio: 4 mcg/mg creat (ref <30)
Microalb, Ur: 0.3 mg/dL

## 2022-05-16 LAB — MAGNESIUM: Magnesium: 2 mg/dL (ref 1.5–2.5)

## 2022-05-16 LAB — VITAMIN D 25 HYDROXY (VIT D DEFICIENCY, FRACTURES): Vit D, 25-Hydroxy: 59 ng/mL (ref 30–100)

## 2022-08-29 ENCOUNTER — Encounter: Payer: Self-pay | Admitting: Obstetrics and Gynecology

## 2022-08-29 ENCOUNTER — Other Ambulatory Visit: Payer: Self-pay | Admitting: Obstetrics and Gynecology

## 2022-08-29 DIAGNOSIS — Z1231 Encounter for screening mammogram for malignant neoplasm of breast: Secondary | ICD-10-CM

## 2022-09-28 ENCOUNTER — Ambulatory Visit
Admission: RE | Admit: 2022-09-28 | Discharge: 2022-09-28 | Disposition: A | Discharge status: Home / Self Care | Payer: Commercial Managed Care - PPO | Source: Ambulatory Visit | Attending: Obstetrics and Gynecology | Admitting: Obstetrics and Gynecology

## 2022-09-28 DIAGNOSIS — Z1231 Encounter for screening mammogram for malignant neoplasm of breast: Secondary | ICD-10-CM

## 2022-10-02 ENCOUNTER — Other Ambulatory Visit: Payer: Self-pay | Admitting: Nurse Practitioner

## 2022-10-02 DIAGNOSIS — E785 Hyperlipidemia, unspecified: Secondary | ICD-10-CM

## 2022-10-26 ENCOUNTER — Other Ambulatory Visit: Payer: Self-pay | Admitting: Nurse Practitioner

## 2022-10-26 DIAGNOSIS — F411 Generalized anxiety disorder: Secondary | ICD-10-CM

## 2022-10-28 ENCOUNTER — Other Ambulatory Visit: Payer: Self-pay | Admitting: Nurse Practitioner

## 2022-10-28 DIAGNOSIS — N951 Menopausal and female climacteric states: Secondary | ICD-10-CM

## 2022-11-22 ENCOUNTER — Ambulatory Visit: Payer: Commercial Managed Care - PPO | Admitting: Nurse Practitioner

## 2022-12-04 NOTE — Progress Notes (Unsigned)
FOLLOW UP  Assessment and Plan:   Hyperlipidemia Continue Rosuvastatin 5 mg QD Continue low cholesterol diet and exercise.  Discussed low saturated fat, high soluble fiber at length Check lipid panel, CBC   BMI 25 Continue to recommend diet heavy in fruits and veggies and low in animal meats, cheeses, and dairy products, appropriate calorie intake Encouraged to restart exercise/ walking Continue to monitor weight at each visit  Allergic Rhinitis Use Mucinex DM and Zyrtec or Allegra If symptoms worsen notify the office  Vitamin D Def At goal at last visit; continue supplementation to maintain goal of 60-100 Defer Vit D level  Anxiety/ Depression Major(HCC) Had stopped Wellbutrin 300 mg every day, symptoms have returned will restart Wellbutrin 150 mg every day and monitor symptoms Stress management techniques discussed, increase water, good sleep hygiene discussed, increase exercise, and increase veggies.   Hot flashes, menopausal Insomia Continue Veozah 45 mg daily Uses Alprazolam 1 mg 1/2 tab rarely - CMP  Cyst left elbow Refer to orthopedics for evaluation  Medication Management - Magnesium    Continue diet and meds as discussed. Further disposition pending results of labs. Discussed med's effects and SE's.   Over 30 minutes of exam, counseling, chart review, and critical decision making was performed.   Future Appointments  Date Time Provider Department Center  05/17/2023 10:00 AM Raynelle Dick, NP [facility] None    ----------------------------------------------------------------------------------------------------------------------  HPI 57 y.o. female  presents for 3 month follow up on cholesterol, depression/anxiety, weight and vitamin D deficiency.   She has been on wellbutrin 300 mg for many years for depression/anxiety, has xanax 0.5-1 mg PRN, reports uses rarely.  Last refill was Xanax 1 mg #30 on 10/26/22. She does use for sleep but tries to use  sparingly but continues to have a lot of trouble sleeping.She stopped taking her Wellbutrin 3 months ago to see how she did without it.  After about 6 weeks she started to have some sadness, irritability.    She is currently on Veozah and hot flashes are well controlled  She has also noticed the cyst that has been present on her left elbow for years has gotten bigger after she accidentally hit it.   She started having sinus congestion, clear nasal mucus and dry cough which started yesterday.    BMI is Body mass index is 25.59 kg/m., she has been working on diet and exercise, has been using rowing machine at home, She is eating more healthy Wt Readings from Last 3 Encounters:  12/05/22 163 lb 6.4 oz (74.1 kg)  05/15/22 160 lb 12.8 oz (72.9 kg)  01/25/22 167 lb 3.2 oz (75.8 kg)   Today their BP is BP: 102/60  BP Readings from Last 3 Encounters:  12/05/22 102/60  05/15/22 102/60  01/25/22 100/62   She does workout. She denies chest pain, shortness of breath, dizziness.    She is  on cholesterol medication, Rosuvastatin 5 mg every day  and denies myalgias. Her cholesterol is at goal, does have maternal vascular hx. The cholesterol last visit was:   Lab Results  Component Value Date   CHOL 183 05/15/2022   HDL 103 05/15/2022   LDLCALC 66 05/15/2022   TRIG 60 05/15/2022   CHOLHDL 1.8 05/15/2022   Last N5A in the office was:  Lab Results  Component Value Date   HGBA1C 5.5 05/15/2022   Patient is on Vitamin D supplement.   Lab Results  Component Value Date   VD25OH 59 05/15/2022  She is on B12 supplement Lab Results  Component Value Date   VITAMINB12 987 04/19/2020      Current Medications:  Current Outpatient Medications on File Prior to Visit  Medication Sig   ALPRAZolam (XANAX) 1 MG tablet TAKE ONE-HALF (1/2) TO ONE TABLET 1 TO 2 TIMES DAILY IF NEEDED FOR ANXIETY AND SLEEP   Cholecalciferol (VITAMIN D PO) Take by mouth.   Cyanocobalamin (B-12 PO) Take by mouth.    Fexofenadine HCl (ALLEGRA PO) Take 180 mg by mouth daily.   rosuvastatin (CRESTOR) 5 MG tablet TAKE 1 TABLET DAILY   VEOZAH 45 MG TABS TAKE 1 TABLET BY MOUTH EVERY DAY   buPROPion (WELLBUTRIN XL) 300 MG 24 hr tablet TAKE 1 TABLET DAILY FOR MOOD (Patient not taking: Reported on 12/05/2022)   No current facility-administered medications on file prior to visit.     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       Hot flashes controlled with Veozah  HENT:  Positive for congestion (clear nasal mucus). Negative for hearing loss and tinnitus.   Eyes:  Negative for blurred vision and double vision.  Respiratory:  Negative for cough, shortness of breath and wheezing.   Cardiovascular:  Negative for chest pain, palpitations, orthopnea, claudication and leg swelling.  Gastrointestinal:  Negative for abdominal pain, blood in stool, constipation, diarrhea, heartburn, melena, nausea and vomiting.  Genitourinary: Negative.   Musculoskeletal:  Negative for joint pain and myalgias.  Skin:  Negative for rash.  Neurological:  Negative for dizziness, tingling, sensory change, weakness and headaches.  Endo/Heme/Allergies:  Negative for polydipsia.  Psychiatric/Behavioral:  Positive for depression. Negative for substance abuse. The patient has insomnia (since hot flashes, typically rare). The patient is not nervous/anxious.   All other systems reviewed and are negative.     Physical Exam: BP 102/60    Pulse 88   Temp (!) 97.5 F (36.4 C)   Ht 5\' 7"  (1.702 m)   Wt 163 lb 6.4 oz (74.1 kg)   SpO2 97%   BMI 25.59 kg/m  Wt Readings from Last 3 Encounters:  12/05/22 163 lb 6.4 oz (74.1 kg)  05/15/22 160 lb 12.8 oz (72.9 kg)  01/25/22 167 lb 3.2 oz (75.8 kg)   General Appearance: Well nourished, in no apparent distress. Eyes: PERRLA, EOMs, conjunctiva no swelling or erythema Sinuses: No Frontal/maxillary tenderness ENT/Mouth: Ext aud canals clear, TMs without erythema, bulging. No erythema, swelling, or exudate on post pharynx. Marland Kitchen Hearing normal.  Neck: Supple, thyroid normal.  Respiratory: Respiratory effort normal, BS equal bilaterally without rales, rhonchi, wheezing or stridor.  Cardio: RRR with no MRGs. Brisk peripheral pulses without edema.  Abdomen: Soft, + BS.  Non tender, no guarding, rebound, hernias, masses. Lymphatics: Non tender without lymphadenopathy.  Musculoskeletal: Full ROM, 5/5 strength, Normal gait. Left elbow has cyst ? Bursa, nonpainful.  Skin: Warm, dry without rashes, lesions, ecchymosis.  Neuro: Cranial nerves intact. No cerebellar symptoms.  Psych: Awake  and oriented X 3, normal affect, Insight and Judgment appropriate.    Raynelle Dick, NP 11:01 AM [facility]

## 2022-12-05 ENCOUNTER — Ambulatory Visit (INDEPENDENT_AMBULATORY_CARE_PROVIDER_SITE_OTHER): Payer: Commercial Managed Care - PPO | Admitting: Nurse Practitioner

## 2022-12-05 ENCOUNTER — Encounter: Payer: Self-pay | Admitting: Nurse Practitioner

## 2022-12-05 VITALS — BP 102/60 | HR 88 | Temp 97.5°F | Ht 67.0 in | Wt 163.4 lb

## 2022-12-05 DIAGNOSIS — F411 Generalized anxiety disorder: Secondary | ICD-10-CM

## 2022-12-05 DIAGNOSIS — F325 Major depressive disorder, single episode, in full remission: Secondary | ICD-10-CM

## 2022-12-05 DIAGNOSIS — E785 Hyperlipidemia, unspecified: Secondary | ICD-10-CM | POA: Diagnosis not present

## 2022-12-05 DIAGNOSIS — E559 Vitamin D deficiency, unspecified: Secondary | ICD-10-CM

## 2022-12-05 DIAGNOSIS — G4700 Insomnia, unspecified: Secondary | ICD-10-CM

## 2022-12-05 DIAGNOSIS — M71322 Other bursal cyst, left elbow: Secondary | ICD-10-CM

## 2022-12-05 DIAGNOSIS — Z79899 Other long term (current) drug therapy: Secondary | ICD-10-CM

## 2022-12-05 DIAGNOSIS — E663 Overweight: Secondary | ICD-10-CM

## 2022-12-05 DIAGNOSIS — J3089 Other allergic rhinitis: Secondary | ICD-10-CM

## 2022-12-05 MED ORDER — BUPROPION HCL ER (XL) 150 MG PO TB24
150.0000 mg | ORAL_TABLET | ORAL | 2 refills | Status: AC
Start: 2022-12-05 — End: 2023-12-05

## 2022-12-05 NOTE — Patient Instructions (Signed)

## 2022-12-06 LAB — CBC WITH DIFFERENTIAL/PLATELET
Absolute Monocytes: 310 {cells}/uL (ref 200–950)
Basophils Absolute: 22 {cells}/uL (ref 0–200)
Basophils Relative: 0.6 %
Eosinophils Absolute: 79 {cells}/uL (ref 15–500)
Eosinophils Relative: 2.2 %
HCT: 40.3 % (ref 35.0–45.0)
Hemoglobin: 13.4 g/dL (ref 11.7–15.5)
Lymphs Abs: 1033 {cells}/uL (ref 850–3900)
MCH: 31.5 pg (ref 27.0–33.0)
MCHC: 33.3 g/dL (ref 32.0–36.0)
MCV: 94.6 fL (ref 80.0–100.0)
MPV: 10.4 fL (ref 7.5–12.5)
Monocytes Relative: 8.6 %
Neutro Abs: 2156 {cells}/uL (ref 1500–7800)
Neutrophils Relative %: 59.9 %
Platelets: 238 10*3/uL (ref 140–400)
RBC: 4.26 10*6/uL (ref 3.80–5.10)
RDW: 11.8 % (ref 11.0–15.0)
Total Lymphocyte: 28.7 %
WBC: 3.6 10*3/uL — ABNORMAL LOW (ref 3.8–10.8)

## 2022-12-12 ENCOUNTER — Encounter: Payer: Self-pay | Admitting: Nurse Practitioner

## 2022-12-12 NOTE — Telephone Encounter (Signed)
Can you check on this please?

## 2023-02-24 ENCOUNTER — Other Ambulatory Visit: Payer: Self-pay | Admitting: Nurse Practitioner

## 2023-02-24 DIAGNOSIS — N951 Menopausal and female climacteric states: Secondary | ICD-10-CM

## 2023-04-19 ENCOUNTER — Other Ambulatory Visit: Payer: Self-pay | Admitting: Nurse Practitioner

## 2023-04-19 DIAGNOSIS — F411 Generalized anxiety disorder: Secondary | ICD-10-CM

## 2023-05-17 ENCOUNTER — Encounter: Payer: Commercial Managed Care - PPO | Admitting: Nurse Practitioner

## 2023-05-18 ENCOUNTER — Encounter: Payer: Commercial Managed Care - PPO | Admitting: Nurse Practitioner

## 2023-08-16 ENCOUNTER — Other Ambulatory Visit: Payer: Self-pay | Admitting: Obstetrics and Gynecology

## 2023-08-16 DIAGNOSIS — Z1231 Encounter for screening mammogram for malignant neoplasm of breast: Secondary | ICD-10-CM

## 2023-09-24 ENCOUNTER — Other Ambulatory Visit (HOSPITAL_COMMUNITY): Payer: Self-pay | Admitting: Family Medicine

## 2023-09-24 DIAGNOSIS — E7800 Pure hypercholesterolemia, unspecified: Secondary | ICD-10-CM

## 2023-09-28 ENCOUNTER — Ambulatory Visit (HOSPITAL_COMMUNITY)
Admission: RE | Admit: 2023-09-28 | Discharge: 2023-09-28 | Disposition: A | Discharge status: Home / Self Care | Payer: Self-pay | Source: Ambulatory Visit | Attending: Family Medicine | Admitting: Family Medicine

## 2023-09-28 DIAGNOSIS — E7800 Pure hypercholesterolemia, unspecified: Secondary | ICD-10-CM | POA: Insufficient documentation

## 2023-10-08 ENCOUNTER — Ambulatory Visit
Admission: RE | Admit: 2023-10-08 | Discharge: 2023-10-08 | Disposition: A | Discharge status: Home / Self Care | Source: Ambulatory Visit | Attending: Obstetrics and Gynecology | Admitting: Obstetrics and Gynecology

## 2023-10-08 DIAGNOSIS — Z1231 Encounter for screening mammogram for malignant neoplasm of breast: Secondary | ICD-10-CM

## 2024-01-11 IMAGING — CT CT HEART MORP W/ CTA COR W/ SCORE W/ CA W/CM &/OR W/O CM
4 of 7 series · 8 of 20 positions shown, 9 images · IV contrast (APPLIED)
Comparison: None.
COMPARISON: None.

Addendum:
EXAM:
OVER-READ INTERPRETATION  CT CHEST

The following report is an over-read performed by radiologist Dr.
Greeny Dessai [REDACTED] on 05/23/2021. This
over-read does not include interpretation of cardiac or coronary
anatomy or pathology. The coronary calcium score/coronary CTA
interpretation by the cardiologist is attached.
HISTORY: 56 yo female with chest pain, nonspecific
Cardiac/Coronary CTA
TECHNIQUE: The patient was scanned on a Siemens Force scanner.
PROTOCOL: A 100 kV prospective scan was triggered in the descending thoracic
aorta at 111 HU's. Axial non-contrast 3 mm slices were carried out
through the heart. The data set was analyzed on a dedicated work
station and scored using the Agatson method. Gantry rotation speed
was 250 msecs and collimation was .6 mm. Beta blockade and 0.8 mg of
sl NTG was given. The 3D data set was reconstructed in 5% intervals
of the 35-75 % of the R-R cycle. Diastolic phases were analyzed on a
dedicated work station using MPR, MIP and VRT modes. The patient
received 100mL OMNIPAQUE IOHEXOL 350 MG/ML SOLN of contrast.

[Series 6: ts diast sharp · axial · 0.38mm/px · z∈[-213,-176]mm · 2 of 281 slices shown]
[im 94/281  lung]
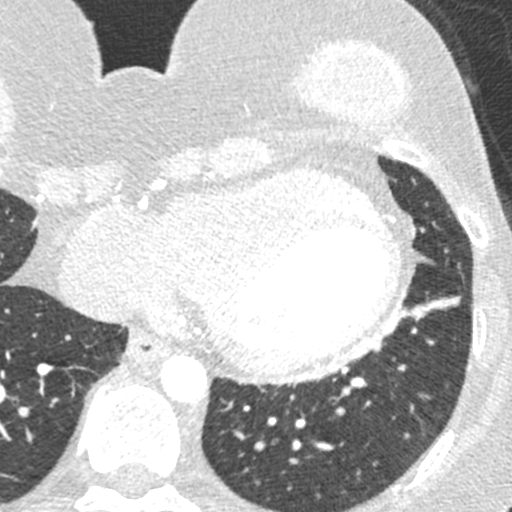
[im 187/281  lung]
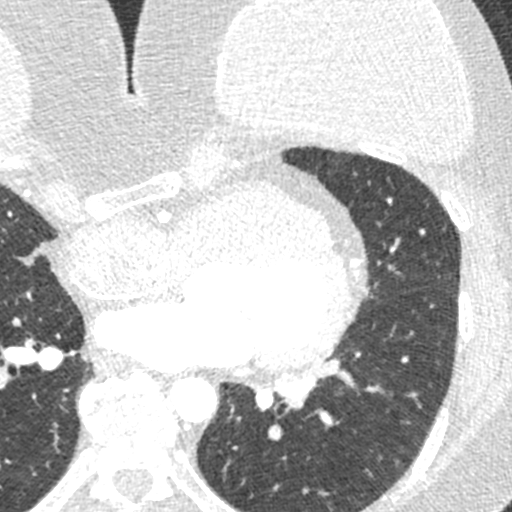

[Series 7: ts syst sharp · axial · 0.38mm/px · z∈[-213,-176]mm · 2 of 281 slices shown]
[im 94/281  lung]
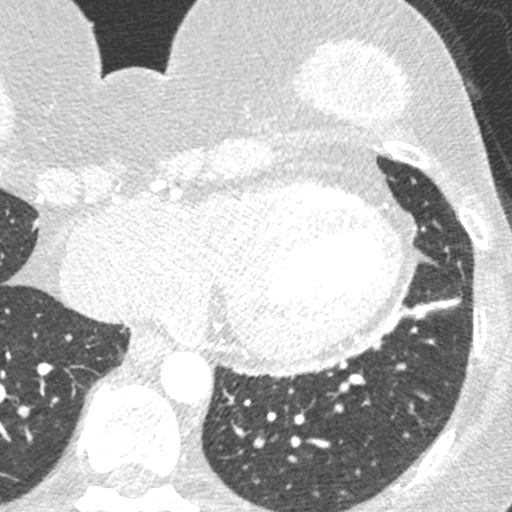
[im 187/281  lung]
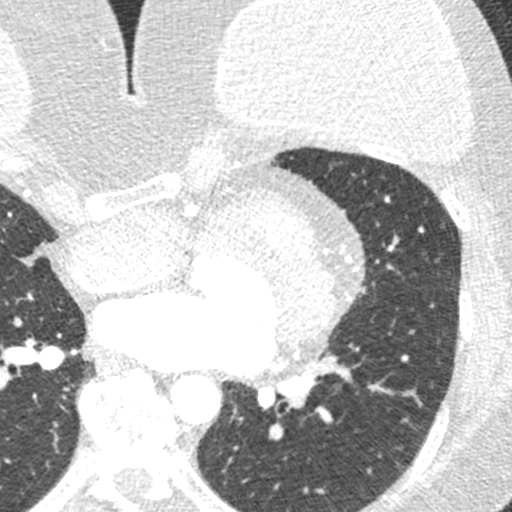

[Series 8: best diast · axial · 0.38mm/px · z∈[-213,-176]mm · 2 of 281 slices shown, 3 images]
[im 94/281  vessel]
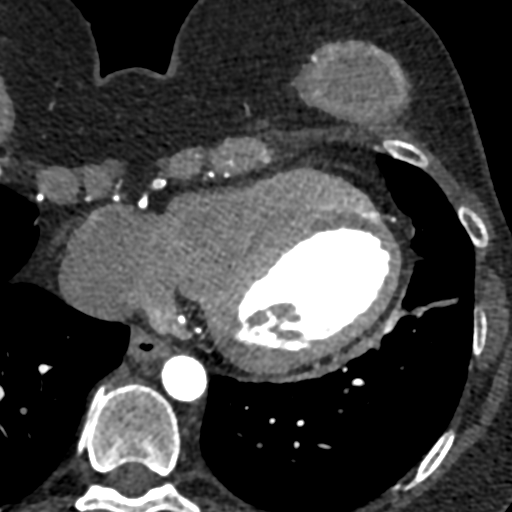
[im 94/281  lung]
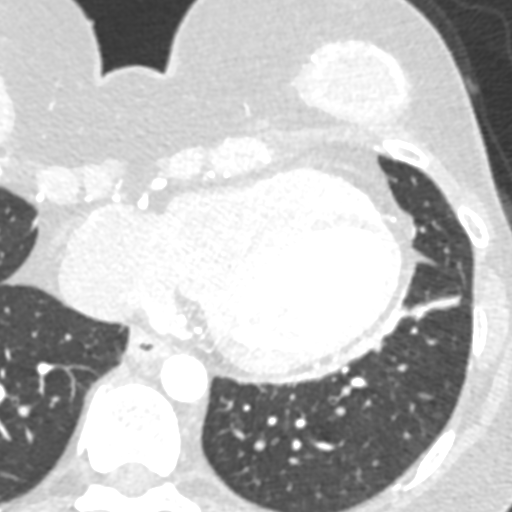
[im 187/281  vessel]
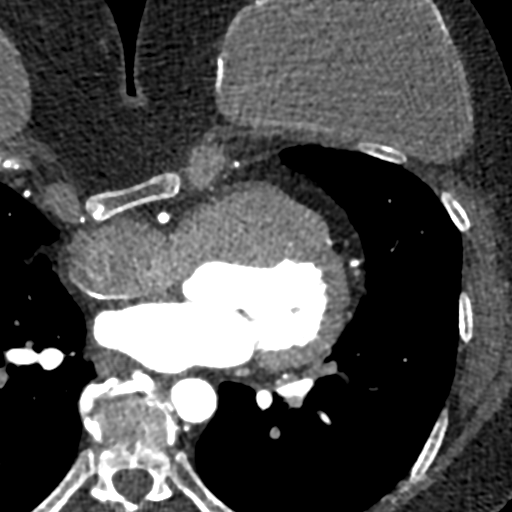

[Series 9: best syst · axial · 0.38mm/px · z∈[-213,-176]mm · 2 of 281 slices shown]
[im 94/281  vessel]
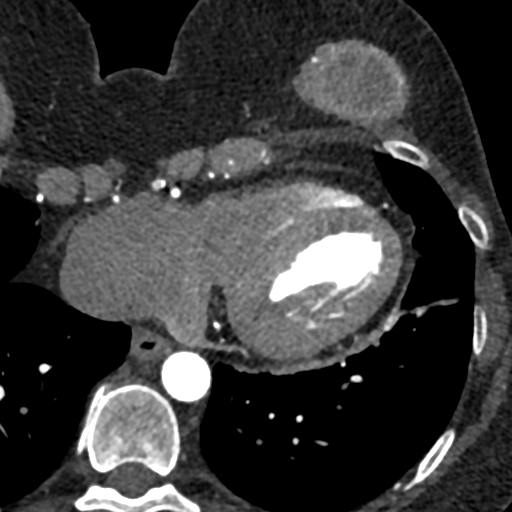
[im 187/281  vessel]
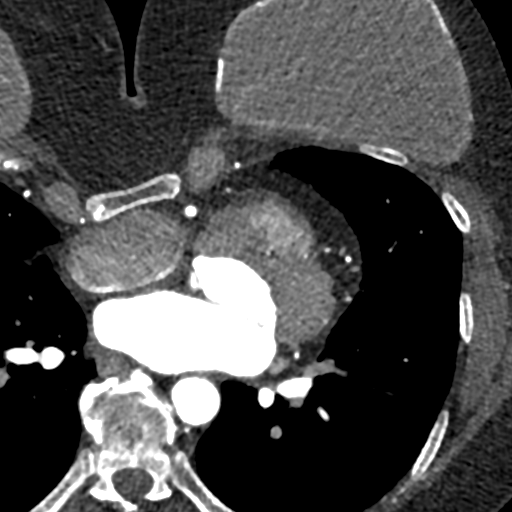

[8 of 20 positions shown; findings below may reference images not displayed]

FINDINGS: Within the visualized portions of the thorax there are no suspicious
appearing pulmonary nodules or masses, there is no acute
consolidative airspace disease, no pleural effusions, no
pneumothorax and no lymphadenopathy. Visualized portions of the
upper abdomen are unremarkable. There are no aggressive appearing
lytic or blastic lesions noted in the visualized portions of the
skeleton. Bilateral breast implants are incidentally noted. Pectus
excavatum.
IMPRESSION: 1. No significant incidental noncardiac findings are noted.
FINDINGS: Quality: Fair, attenuation artifact, HR 56

Coronary calcium score: The patient's coronary artery calcium score
is 0, which places the patient in the 0 percentile.

Coronary arteries: Normal coronary origins.  Right dominance.

Right Coronary Artery: Dominant.  Normal vessel.

Left Main Coronary Artery: Long vessel without disease. Bifurcates
into the LAD and LCx arteries.

Left Anterior Descending Coronary Artery: Reaches the apex
anteriorly. No disease. Several small diagonal branches.

Left Circumflex Artery: AV groove vessel without disease.

Aorta: Normal size, 25 mm at the mid ascending aorta (level of the
PA bifurcation) measured double oblique. No calcifications. No
dissection.

Aortic Valve: Trileaflet. No calcifications.

Other findings:

Normal pulmonary vein drainage into the left atrium.

Normal left atrial appendage without a thrombus.

The pulmonary artery was not completely visualized.

Pectus excavatum
IMPRESSION: 1. No evidence of CAD, CADRADS = 0.

2. Coronary calcium score of 0. This was 0 percentile for age and
sex matched control.

3. Normal coronary origin with right dominance.

4. Consider non-coronary causes of chest pain, ?related to pectus
excavatum

*** End of Addendum ***
EXAM:
OVER-READ INTERPRETATION  CT CHEST

The following report is an over-read performed by radiologist Dr.
Greeny Dessai [REDACTED] on 05/23/2021. This
over-read does not include interpretation of cardiac or coronary
anatomy or pathology. The coronary calcium score/coronary CTA
interpretation by the cardiologist is attached.
FINDINGS: Within the visualized portions of the thorax there are no suspicious
appearing pulmonary nodules or masses, there is no acute
consolidative airspace disease, no pleural effusions, no
pneumothorax and no lymphadenopathy. Visualized portions of the
upper abdomen are unremarkable. There are no aggressive appearing
lytic or blastic lesions noted in the visualized portions of the
skeleton. Bilateral breast implants are incidentally noted. Pectus
excavatum.
IMPRESSION: 1. No significant incidental noncardiac findings are noted.
